# Patient Record
Sex: Female | Born: 1980 | Race: White | Hispanic: No | Smoking: Never smoker
Health system: Southern US, Community
[De-identification: ages and names within clinical notes are randomized; demographics above are authoritative.]

## PROBLEM LIST (undated history)

## (undated) DIAGNOSIS — N200 Calculus of kidney: Secondary | ICD-10-CM

## (undated) DIAGNOSIS — N970 Female infertility associated with anovulation: Secondary | ICD-10-CM

## (undated) DIAGNOSIS — N02B9 Other recurrent and persistent immunoglobulin A nephropathy: Secondary | ICD-10-CM

## (undated) DIAGNOSIS — N028 Recurrent and persistent hematuria with other morphologic changes: Secondary | ICD-10-CM

## (undated) HISTORY — PX: RENAL BIOPSY: SHX156

---

## 2007-04-16 ENCOUNTER — Inpatient Hospital Stay (HOSPITAL_COMMUNITY): Admission: EM | Admit: 2007-04-16 | Discharge: 2007-04-17 | Payer: Self-pay | Admitting: Emergency Medicine

## 2010-07-22 ENCOUNTER — Inpatient Hospital Stay (HOSPITAL_COMMUNITY)
Admission: AD | Admit: 2010-07-22 | Discharge: 2010-07-22 | Disposition: A | Discharge status: Home / Self Care | Payer: BC Managed Care – PPO | Source: Ambulatory Visit | Attending: Obstetrics and Gynecology | Admitting: Obstetrics and Gynecology

## 2010-07-22 DIAGNOSIS — R300 Dysuria: Secondary | ICD-10-CM | POA: Insufficient documentation

## 2010-07-22 DIAGNOSIS — N390 Urinary tract infection, site not specified: Secondary | ICD-10-CM | POA: Insufficient documentation

## 2010-07-22 DIAGNOSIS — O2390 Unspecified genitourinary tract infection in pregnancy, unspecified trimester: Secondary | ICD-10-CM | POA: Insufficient documentation

## 2010-07-22 LAB — URINALYSIS, ROUTINE W REFLEX MICROSCOPIC
Bilirubin Urine: NEGATIVE
Ketones, ur: NEGATIVE mg/dL
Nitrite: NEGATIVE
Protein, ur: NEGATIVE mg/dL
pH: 6 (ref 5.0–8.0)

## 2010-07-22 LAB — URINE MICROSCOPIC-ADD ON

## 2010-07-23 LAB — URINE CULTURE
Colony Count: NO GROWTH
Culture  Setup Time: 201202051953

## 2010-07-25 ENCOUNTER — Inpatient Hospital Stay (HOSPITAL_COMMUNITY)
Admission: AD | Admit: 2010-07-25 | Payer: BC Managed Care – PPO | Source: Ambulatory Visit | Admitting: Obstetrics and Gynecology

## 2010-07-25 ENCOUNTER — Inpatient Hospital Stay (HOSPITAL_COMMUNITY): Payer: BC Managed Care – PPO

## 2010-07-25 ENCOUNTER — Observation Stay (HOSPITAL_COMMUNITY)
Admission: AD | Admit: 2010-07-25 | Discharge: 2010-07-27 | DRG: 886 | Disposition: A | Discharge status: Home / Self Care | Payer: BC Managed Care – PPO | Source: Ambulatory Visit | Attending: Obstetrics and Gynecology | Admitting: Obstetrics and Gynecology

## 2010-07-25 DIAGNOSIS — N200 Calculus of kidney: Secondary | ICD-10-CM | POA: Diagnosis present

## 2010-07-25 DIAGNOSIS — O26839 Pregnancy related renal disease, unspecified trimester: Principal | ICD-10-CM | POA: Diagnosis present

## 2010-07-25 DIAGNOSIS — N1330 Unspecified hydronephrosis: Secondary | ICD-10-CM | POA: Diagnosis present

## 2010-07-25 DIAGNOSIS — R1031 Right lower quadrant pain: Secondary | ICD-10-CM | POA: Diagnosis present

## 2010-07-25 DIAGNOSIS — O212 Late vomiting of pregnancy: Secondary | ICD-10-CM | POA: Diagnosis present

## 2010-07-25 LAB — URINALYSIS, DIPSTICK ONLY
Bilirubin Urine: NEGATIVE
Nitrite: NEGATIVE
Specific Gravity, Urine: <1.005 — ABNORMAL LOW (ref 1.005–1.030)
Urine Glucose, Fasting: NEGATIVE mg/dL
pH: 6 (ref 5.0–8.0)

## 2010-07-25 LAB — COMPREHENSIVE METABOLIC PANEL
Albumin: 3.1 g/dL — ABNORMAL LOW (ref 3.5–5.2)
BUN: 5 mg/dL — ABNORMAL LOW (ref 6–23)
Calcium: 8.8 mg/dL (ref 8.4–10.5)
Creatinine, Ser: 0.52 mg/dL (ref 0.4–1.2)
Glucose, Bld: 72 mg/dL (ref 70–99)
Total Protein: 6.5 g/dL (ref 6.0–8.3)

## 2010-07-25 LAB — CBC
HCT: 36.1 % (ref 36.0–46.0)
MCHC: 33.2 g/dL (ref 30.0–36.0)
MCV: 89.1 fL (ref 78.0–100.0)
Platelets: 249 10*3/uL (ref 150–400)
RDW: 13.4 % (ref 11.5–15.5)

## 2010-07-26 ENCOUNTER — Inpatient Hospital Stay (HOSPITAL_COMMUNITY): Payer: BC Managed Care – PPO

## 2010-07-26 ENCOUNTER — Encounter (HOSPITAL_COMMUNITY): Payer: Self-pay | Admitting: Radiology

## 2010-07-26 LAB — DIFFERENTIAL
Basophils Relative: 0 % (ref 0–1)
Lymphocytes Relative: 12 % (ref 12–46)
Lymphs Abs: 1.8 10*3/uL (ref 0.7–4.0)
Monocytes Relative: 8 % (ref 3–12)
Neutro Abs: 12.5 10*3/uL — ABNORMAL HIGH (ref 1.7–7.7)
Neutrophils Relative %: 81 % — ABNORMAL HIGH (ref 43–77)

## 2010-07-26 LAB — CBC
HCT: 33.6 % — ABNORMAL LOW (ref 36.0–46.0)
Hemoglobin: 10.9 g/dL — ABNORMAL LOW (ref 12.0–15.0)
MCH: 29.5 pg (ref 26.0–34.0)
RBC: 3.69 MIL/uL — ABNORMAL LOW (ref 3.87–5.11)

## 2010-07-26 LAB — CREATININE CLEARANCE, URINE, 24 HOUR
Creatinine Clearance: 169 mL/min — ABNORMAL HIGH (ref 75–115)
Creatinine, 24H Ur: 1263 mg/d (ref 700–1800)
Urine Total Volume-CRCL: 4400 mL

## 2010-07-27 ENCOUNTER — Encounter (HOSPITAL_COMMUNITY): Payer: Self-pay | Admitting: *Deleted

## 2010-07-27 LAB — PROTEIN, URINE, 24 HOUR: Collection Interval-UPROT: 24 hours

## 2010-08-10 NOTE — H&P (Signed)
NAME:  April Bates, April Bates               ACCOUNT NO.:  [id number]  MEDICAL RECORD NO.:  [id number]           PATIENT TYPE:  I  LOCATION:  9153                          [facility]ILITY:  [facility]  PHYSICIAN:  Lenoard Aden, M.D.DATE OF BIRTH:  January 01, 1981  DATE OF ADMISSION:  07/25/2010 DATE OF DISCHARGE:                             HISTORY & PHYSICAL   CHIEF COMPLAINT:  Nausea, vomiting and flank pain.  HISTORY OF PRESENT ILLNESS:  She is a 30 year old white female G1, P0 at 82 weeks' gestation.  The patient was previously seen in maternity admissions on July 22, 2010, with diagnosis of probable UTI, questionable pyelonephritis and urinalysis was consistent with esterase and nitrite positive with blood.  She was given a shot of Rocephin in maternity admissions and sent home with p.o. Keflex.  She had contractions at that time but no preterm cervical change.  She presented to the office in followup 24 hours ago with persistent slight discomfort in no preterm cervical change and was tolerating antibiotic as well. Last night, there was exacerbation of her nausea and vomiting and worsening of her flank pain and she presents to the hospital now with worsening flank pain.  She reports good fetal movement.  She denies regular contractions, bleeding, changing vaginal discharge or leakage of fluid.  She has a family history which is remarkable for diabetes, brain cancer and alcohol abuse and COPD.  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  SOCIAL HISTORY:  She is a nonsmoker, nondrinker.  She denies domestic or physical violence.  MEDICATIONS:  Prenatal vitamins.  Prenatal course to date has otherwise been uncomplicated.  PHYSICAL  EXAMINATION:  GENERAL:  The patient is an uncomfortable- appearing white female but not in acute distress. VITAL SIGNS:  Blood pressure is 114/68, temperature 98.3. HEENT:  Normal. NECK:  Supple.  Full range of motion. LUNGS:  Clear. HEART:  Regular rhythm. ABDOMEN:  Soft, gravid, nontender.  There is distinct tenderness to palpation along the right flank moving around to the right lower quadrant. ABDOMEN:  Soft and nontender.  There is no rebound or guarding noted. There is negative Murphy sign. BACK:  There is no CVA tenderness. PELVIC:  Cervix to be closed, about 30% effaced, vertex and -2. EXTREMITIES:  There are no cords. NEUROLOGIC:  Intact. SKIN:  Nonfocal.  NST is reactive.  No active contractions were noted.  CBC, CMP are pending, 24-hour urine was started, clean catch pending.  IMPRESSION: 1. A 35-week intrauterine pregnancy. 2. Worsening flank pain with negative urine culture on July 22, 2010.  Signs and symptoms suggestive of possible urolithiasis     versus pyelonephritis. 3. History of IgA nephropathy with documented by kidney biopsy with     normal baseline 24-hour urine.  PLAN:  To admit at this time.  We will administer ceftriaxone 1 g IV q.24 h.  We will obtain renal ultrasound.  Check CBC, CMP, urinalysis, start 24-hour urine for creatinine clearance and urinary protein excretion.  Given Stadol 1 mg IV and Phenergan for pain relief at this time.     Lenoard Aden, M.D.     RJT/MEDQ  D:  07/25/2010  T:  07/26/2010  Job:  [id number]  Electronically Signed by Olivia Mackie M.D. on 08/10/2010 11:07:36 AM

## 2010-08-31 NOTE — Consult Note (Signed)
NAME:  April Bates, April Bates               ACCOUNT NO.:  [id number]  MEDICAL RECORD NO.:  [id number]           PATIENT TYPE:  I  LOCATION:  9153                          [facility]ILITY:  [facility]  PHYSICIAN:  Danielly Ackerley C. Vernie Ammons, M.D.  DATE OF BIRTH:  1981-05-17  DATE OF CONSULTATION:  07/26/2010 DATE OF DISCHARGE:                                CONSULTATION   REQUESTING PHYSICIAN:  Lenoard Aden, MD  HISTORY OF PRESENT ILLNESS:  This is a 30 year old white female who is [redacted] weeks pregnant, admitted on July 25, 2010 with irretractable nausea, vomiting, and right flank pain.  The right flank pain has been progressively worsening over the past 2-3 days.  She denies gross hematuria.  She was treated recently for a suspected UTI versus early pyelonephritis and has been taking p.o. Keflex daily.  She denies any prior history of nephrolithiasis.  She currently denies dysuria, increased frequency, urgency, fever or chills.  On admission, a renal ultrasound was performed which did demonstrate some right hydronephrosis as well as left renal pelvis fullness but no urinary calculi were detected.  The patient's right flank pain persisted and therefore a CT abdomen and pelvis was performed.  CT A and P performed on July 26, 2010 confirms a 4-mm right distal ureteral calculus at the ureterovesical junction.  Also, bilateral nonobstructing renal calculi.  LABORATORY DATA:  Urinalysis at admission was negative except for some blood.  Urine culture and sensitivity was negative. CBC at admission showed an elevated white count at 15.  BUN and creatinine are normal.  PHYSICAL EXAMINATION:  GENERAL:  The patient is normocephalic and atraumatic.  She is alert and oriented x3 in no acute distress. VITAL SIGNS:  Vital signs are stable and the patient is afebrile. ABDOMEN:  The abdomen is gravid.  She is nontender and there are normal bowel sounds. BACK:  There is no CVA tenderness elicited.  For a full review of  past medical history, social history, family history, and surgical history, please refer to the admission H and P.  ASSESSMENT AND PLAN:  This is a 35-week pregnant female with a right distal ureteral stone at the ureterovesical junction which measures 4 mm.  She also has multiple nonobstructing bilateral renal calculi. 1. Recommend Toradol 15 mg IV q. 4 hours p.r.n. pain if okay per     Obstetrics. 2. Strain urine until stone passes. 3. Flomax 0.4 mg p.o. daily until stone passes. 4. Once the patient is tolerating p.o. medications, we would advise     Percocet 5/325 mg 1-2 p.o. q.4 h. p.r.n. pain and Zofran 8 mg     dissolvable tablets q.6 h. p.r.n. nausea and vomiting. 5. The patient will need to arrange a followup with Urology as an     outpatient as needed between now and delivery.  Otherwise, she     should call the office and arrange followup sometime after delivery     to discuss stone prevention and also obtain metabolic stone     evaluation in light of multiple bilateral renal stones.    ______________________________ Marcello Fennel, PA   ______________________________ Veverly Fells.  Vernie Ammons, M.D.    AB/MEDQ  D:  07/26/2010  T:  07/27/2010  Job:  [id number]  Electronically Signed by Marcello Fennel PA on 08/30/2010 04:16:03 PM Electronically Signed by Ihor Gully M.D. on 08/31/2010 01:48:08 PM

## 2010-09-01 ENCOUNTER — Inpatient Hospital Stay (HOSPITAL_COMMUNITY)
Admission: AD | Admit: 2010-09-01 | Discharge: 2010-09-03 | DRG: 373 | Disposition: A | Discharge status: Home / Self Care | Payer: BC Managed Care – PPO | Source: Ambulatory Visit | Attending: Obstetrics and Gynecology | Admitting: Obstetrics and Gynecology

## 2010-09-01 ENCOUNTER — Inpatient Hospital Stay (HOSPITAL_COMMUNITY)
Admission: AD | Admit: 2010-09-01 | Payer: BC Managed Care – PPO | Source: Ambulatory Visit | Admitting: Obstetrics and Gynecology

## 2010-09-01 DIAGNOSIS — N059 Unspecified nephritic syndrome with unspecified morphologic changes: Secondary | ICD-10-CM | POA: Diagnosis present

## 2010-09-01 DIAGNOSIS — O480 Post-term pregnancy: Principal | ICD-10-CM | POA: Diagnosis present

## 2010-09-01 DIAGNOSIS — O26839 Pregnancy related renal disease, unspecified trimester: Secondary | ICD-10-CM | POA: Diagnosis present

## 2010-09-01 LAB — CBC
HCT: 36.4 % (ref 36.0–46.0)
Hemoglobin: 12.1 g/dL (ref 12.0–15.0)
MCH: 29.7 pg (ref 26.0–34.0)
MCHC: 33.2 g/dL (ref 30.0–36.0)

## 2010-09-01 LAB — RPR: RPR Ser Ql: NONREACTIVE

## 2010-09-02 LAB — CBC
HCT: 35 % — ABNORMAL LOW (ref 36.0–46.0)
Hemoglobin: 11.5 g/dL — ABNORMAL LOW (ref 12.0–15.0)
MCH: 29.6 pg (ref 26.0–34.0)
MCHC: 32.9 g/dL (ref 30.0–36.0)

## 2010-09-06 ENCOUNTER — Ambulatory Visit (HOSPITAL_COMMUNITY)
Admission: RE | Admit: 2010-09-06 | Discharge: 2010-09-06 | Disposition: A | Discharge status: Home / Self Care | Payer: BC Managed Care – PPO | Source: Ambulatory Visit | Attending: Obstetrics and Gynecology | Admitting: Obstetrics and Gynecology

## 2010-09-06 DIAGNOSIS — O9212 Cracked nipple associated with the puerperium: Secondary | ICD-10-CM | POA: Insufficient documentation

## 2010-09-06 DIAGNOSIS — O9279 Other disorders of lactation: Secondary | ICD-10-CM | POA: Insufficient documentation

## 2010-09-06 NOTE — H&P (Signed)
  April Bates, April Bates               ACCOUNT NO.:  [id number]  MEDICAL RECORD NO.:  [id number]           PATIENT TYPE:  I  LOCATION:  9134                          [facility]  [facility]  PHYSICIAN:  Lenoard Aden, M.D.DATE OF BIRTH:  1980/06/28  DATE OF ADMISSION:  09/01/2010 DATE OF DISCHARGE:                             HISTORY & PHYSICAL   CHIEF COMPLAINT:  Postdates with IgA nephropathy for induction.  HISTORY OF PRESENT ILLNESS:  A 30 year old white female G1 P0 at 40-3/7 weeks' gestation for induction due to postdate status, favorable cervix, and IgA nephropathy.  PAST MEDICAL HISTORY:  Remarkable for IgA nephropathy documented by biopsy and urolithiasis during this pregnancy.  Her medical history is also remarkable for Guillain-Barre syndrome in 2002.  She has allergies to SULFA.  MEDICATIONS:  Prenatal vitamins.  She is a nonsmoker and nondrinker.  She denies domestic or physical violence.  She has social history which is otherwise noncontributory.  FAMILY HISTORY:  Remarkable for COPD, brain cancer, diabetes, and alcohol abuse.  Prenatal course complicated by urolithiasis for which the patient was hospitalized and managed expectantly and passed the stone.  PHYSICAL EXAMINATION:  GENERAL:  She is a well-developed, well-nourished white female in no acute distress. HEENT:  Normal. LUNGS:  Clear. HEART:  Regular rate and rhythm. ABDOMEN:  Soft, gravid, nontender.  Estimated fetal weight 7.5 pounds. Cervix is 4 cm, 70% vertex, -1. EXTREMITIES:  There are no cords. NEUROLOGIC:  Nonfocal. SKIN:  Intact.  Fetal heart rate tracing in the 140-150 beat per minute range with accelerations 15 x 15, no decelerations, irregular contractions category I tracing.  IMPRESSION: 1. Postdates intrauterine pregnancy. 2. IgA nephropathy. 3. History of urolithiasis.  PLAN:  Admit, Pitocin induction, epidural and analgesia as needed, anticipate cautious attempts at vaginal  delivery.     Lenoard Aden, M.D.     RJT/MEDQ  D:  09/01/2010  T:  09/02/2010  Job:  [id number]  Electronically Signed by Olivia Mackie M.D. on 09/06/2010 01:28:09 PM

## 2010-09-06 NOTE — H&P (Signed)
  April Bates, April Bates               ACCOUNT NO.:  [id number]  MEDICAL RECORD NO.:  [id number]           PATIENT TYPE:  I  LOCATION:  9162                          [facility]  PHYSICIAN:  Lenoard Aden, M.D.DATE OF BIRTH:  January 11, 1981  DATE OF ADMISSION:  09/01/2010 DATE OF DISCHARGE:                             HISTORY & PHYSICAL   CHIEF COMPLAINT:  Postdates induction with a history of IgA nephropathy.  HISTORY OF PRESENT ILLNESS:  A 30 year old white female G1, P0 at 25- 3/7th weeks' gestation, who presents for induction due to postdate status, favorable cervix, and IgA nephropathy.  She has past medical history remarkable for IJ nephropathy documented by kidney biopsy.  She has a history of Guillain-Barre syndrome in 2002.  She is a nonsmoker, nondrinker.  Denies domestic physical violence.  ALLERGIES:  She has allergies to SULFA.  MEDICATIONS:  Prenatal vitamins.  PREGNANCY COURSE:  Complicated by urolithiasis.  PHYSICAL EXAMINATION:  VITAL SIGNS:  Stable. GENERAL:  Patient is afebrile. HEENT:  Normal. NECK:  Supple.  Full range of motion. LUNGS:  Clear. HEART:  Regular rhythm. ABDOMEN:  Soft, gravid, nontender.  Estimated fetal weight 7.5 pounds. Cervix is 4 cm, 70%, vertex, -1. EXTREMITIES:  No cords. NEUROLOGIC:  Nonfocal. SKIN:  Intact.  IMPRESSION:  A 40-3/7th-week intrauterine pregnancy for; 1. Postdates induction. 2. IgA nephropathy. 3. History of urolithiasis.  PLAN:  Admit.  Pitocin induction, epidural p.r.n.  Anticipate cautious attempts at vaginal delivery.     Lenoard Aden, M.D.     RJT/MEDQ  D:  09/01/2010  T:  09/01/2010  Job:  [id number]  Electronically Signed by Olivia Mackie M.D. on 09/06/2010 01:28:04 PM

## 2010-10-11 ENCOUNTER — Ambulatory Visit (HOSPITAL_COMMUNITY)
Admission: RE | Admit: 2010-10-11 | Discharge: 2010-10-11 | Disposition: A | Discharge status: Home / Self Care | Payer: BC Managed Care – PPO | Source: Ambulatory Visit | Attending: Obstetrics and Gynecology | Admitting: Obstetrics and Gynecology

## 2010-10-30 NOTE — Discharge Summary (Signed)
NAMEMarland Kitchen  April Bates, April Bates               ACCOUNT NO.:  [id number]   MEDICAL RECORD NO.:  [id number]          PATIENT TYPE:  INP   LOCATION:  5010                         FACILITY:  [facility]   PHYSICIAN:  Altha Harm, MDDATE OF BIRTH:  17-Aug-1980   DATE OF ADMISSION:  04/16/2007  DATE OF DISCHARGE:  04/17/2007                               DISCHARGE SUMMARY   DISCHARGE DISPOSITION:  Home.   FINAL DISCHARGE DIAGNOSES:  Allergic response to Bactrim.   DISCHARGE MEDICATIONS:  1. Prednisone 40 mg p.o. daily x5 days.  2. Benadryl 25 to 50 mg p.o. q.8h. p.r.n.   CONSULTANTS:  None.   PROCEDURES:  None.   DIAGNOSTIC STUDIES:  None.   CODE STATUS:  Full code.   ALLERGIES:  BACTRIM.   CHIEF COMPLAINT:  Fever and full-body rash.   HISTORY OF PRESENT ILLNESS:  Please see H&P dictated by Dr. Mikeal Hawthorne for  details of the HPI.   HOSPITAL COURSE:  1. The patient was admitted and found to have a morbilliform rash in      response to Bactrim.  The patient was started on IV Solu-Medrol and      IV fluids for support.  The patient had significant resolution of      her symptoms after the IV Solu-Medrol and she was switched over to      p.o. prednisone.  The rash continued to resolve as the day went      along and the patient no longer had any fevers.  The patient is      being discharged home on prednisone for a total of 5 days and      Benadryl, to be used p.r.n. for itching.  2. Hyponatremia, resolved with IV fluids.  3. Hypertension, resolved.   DIETARY RESTRICTIONS:  None.   PHYSICAL RESTRICTIONS:  None.   FOLLOWUP:  Patient to follow up with her dermatologist in 72 hours for  reevaluation.  Patient has been cautioned that if the rash continues to  get worse, if she notices any desquamation or she notices any difficulty  swallowing or soreness of her mucosal membranes, that she is to come  back to the emergency room for continued therapy.      Altha Harm, MD  Electronically Signed     MAM/MEDQ  D:  04/17/2007  T:  04/18/2007  Job:  [id number]

## 2010-10-30 NOTE — H&P (Signed)
NAME:  April Bates, April Bates               ACCOUNT NO.:  [id number]   MEDICAL RECORD NO.:  [id number]          PATIENT TYPE:  EMS   LOCATION:  MAJO                         FACILITY:  [facility]   PHYSICIAN:  Lonia Blood, M.D.      DATE OF BIRTH:  04/15/1981   DATE OF ADMISSION:  04/16/2007  DATE OF DISCHARGE:                              HISTORY & PHYSICAL   PRIMARY CARE PHYSICIAN:  The patient goes to [facility], so she is  unassigned to [place].   PRESENTING COMPLAINT:  Generalized rashes.   HISTORY OF PRESENT ILLNESS:  The patient is a 30 year old female with no  significant past medical history who apparently had some herpes labialis  a couple of days ago.  She went to her primary care doctor, at which  point she was placed on Bactrim for treatment of the lesion.  Two days  later, the patient had a complete skin breakdown with rashes.  She went  back to her primary care physician and was referred to a dermatologist.  She saw Dr. Michaell Cowing, who is a dermatologist.  Symptoms persisted so she  was instructed to stop all medications, including the Bactrim.  The  patient was noted to have nausea and vomiting, however, today, and after  consulting her dermatologist she was referred to the hospital with  questionable Stevens-Johnson syndrome.  The patient denied any diarrhea,  but she has had fever and chills as well.  So far no mucosal lesions.   PAST MEDICAL HISTORY:  Nothing significant.   SOCIAL HISTORY:  The patient is single.  Denies any tobacco or alcohol  use or IV drug use.   FAMILY HISTORY:  Remote history of COPD in her grandfather, otherwise  nothing significant.   MEDICATIONS:  None.   REVIEW OF SYSTEMS:  A 12-point review of systems is negative except per  HPI.   PHYSICAL EXAMINATION:  VITAL SIGNS:  Temperature 102.7, blood pressure  108/68, pulse 117, respiratory rate 20, sats 98% room air.  GENERAL:  She is a pleasant young woman in no acute distress.  HEENT:  PERRL.  EOMI.  NECK:   Supple.  No JVD, no lymphadenopathy.  RESPIRATORY:  She has good air entry bilaterally.  No wheezes, no rales.  CARDIOVASCULAR:  The patient has S1-S2.  No murmurs.  ABDOMEN:  Soft, nontender with positive bowel sounds.  EXTREMITIES:  No  edema, cyanosis or clubbing.  SKIN:  The skin exam showed widespread raised maculopapular lesions  involving the face, the trunk and the limbs.  She has a crusting  herpetic lesion on the right upper lip, otherwise no other lesions.   Her labs showed a white count is 6.2, hemoglobin 13.8, with platelet  count of 164.  Sodium is 131, potassium 3.8, chloride 101, BUN 8,  glucose 86, creatinine 0.9.   ASSESSMENT:  This is a 30 year old female with widespread lesions,  probably questionable Stevens-Johnson syndrome but could also be sulfa  allergy or any reaction to her Bactrim.  The plan therefore will be:  1. 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 The patient has seen Dr. Michaell Cowing,  who is a Armed forces operational officer, and currently taken off all medications.      It is not clear if she truly has Stevens-Johnson or any of the      related syndromes.  At this point, based on the literature, the use      steroid is controversial, especially if it is more than 48 hours;      however, the patient has not even tried steroids from day one.  Our      plan will therefore will be to give her a short course of no more      than 24 hours of steroids.  I will use Solu-Medrol in this case to      see if that helps improvement.  If not would consider IVIG.  The      bulk of the management, however, will be supportive care.  I will      observe her closely in the hospital for any untoward problems.  2. Nausea, vomiting.  Although the patient had no mucosal lesions to      suggest Stevens-Johnson, the fact that she is having nausea and      vomiting could mean she may have some lesions in her GI tract that      we are unable to see.  At this point would do supportive measures      to  control her nausea, vomiting, including Phenergan and Zofran as      needed.  3. Hyponatremia.  The patient's sodium is low, probably from      dehydration from her nausea and vomiting.  I will therefore give      her some normal saline and recheck her labs in the morning.  4. Fever.  This is most likely related to her overall lesions if she      truly has Levonne Spiller.  I will check blood cultures and      probably UA on her.  Otherwise further treatment will depend on how      the patient responds while in the hospital as this is going to be      entirely for the most part supportive care.      Lonia Blood, M.D.  Electronically Signed     LG/MEDQ  D:  04/16/2007  T:  04/17/2007  Job:  [id number]

## 2010-11-20 ENCOUNTER — Ambulatory Visit (HOSPITAL_COMMUNITY)
Admission: RE | Admit: 2010-11-20 | Payer: BC Managed Care – PPO | Source: Ambulatory Visit | Admitting: Obstetrics and Gynecology

## 2011-03-27 LAB — CULTURE, BLOOD (ROUTINE X 2): Culture: NO GROWTH

## 2011-03-27 LAB — URINALYSIS, ROUTINE W REFLEX MICROSCOPIC
Bilirubin Urine: NEGATIVE
Glucose, UA: NEGATIVE
Ketones, ur: >80 — AB
Leukocytes, UA: NEGATIVE
Nitrite: NEGATIVE
Protein, ur: NEGATIVE
Specific Gravity, Urine: 1.019
Urobilinogen, UA: 0.2
pH: 6

## 2011-03-27 LAB — BASIC METABOLIC PANEL
CO2: 17 — ABNORMAL LOW
Chloride: 108
GFR calc non Af Amer: >60
Glucose, Bld: 91
Potassium: 4.3
Sodium: 137

## 2011-03-27 LAB — URINE MICROSCOPIC-ADD ON

## 2011-03-27 LAB — CBC
HCT: 38.2
Hemoglobin: 13.1
MCHC: 34.4
MCV: 86.5
MCV: 87.1
Platelets: 164
RDW: 13.1

## 2011-03-27 LAB — COMPREHENSIVE METABOLIC PANEL
Albumin: 3.5
Alkaline Phosphatase: 42
BUN: 6
Calcium: 8 — ABNORMAL LOW
Creatinine, Ser: 0.88
Potassium: 3.6
Total Protein: 6.2

## 2011-03-27 LAB — I-STAT 8, (EC8 V) (CONVERTED LAB)
Acid-base deficit: 4 — ABNORMAL HIGH
BUN: 8
Bicarbonate: 17.9 — ABNORMAL LOW
Chloride: 101
Glucose, Bld: 86
HCT: 43
Hemoglobin: 14.6
Operator id: 275321
Potassium: 3.8
Sodium: 131 — ABNORMAL LOW
TCO2: 19
pCO2, Ven: 25.1 — ABNORMAL LOW
pH, Ven: 7.46 — ABNORMAL HIGH

## 2011-03-27 LAB — POCT I-STAT CREATININE
Creatinine, Ser: 0.9
Operator id: 275321

## 2011-03-27 LAB — DIFFERENTIAL
Basophils Relative: 0
Eosinophils Absolute: 0.2
Neutrophils Relative %: 80 — ABNORMAL HIGH

## 2012-02-13 NOTE — Discharge Summary (Signed)
April Bates, April Bates               ACCOUNT NO.:  [id number]  MEDICAL RECORD NO.:  [id number]  LOCATION:  9153                          [facility]  PHYSICIAN:  Lenoard Aden, M.D.DATE OF BIRTH:  1981/05/29  DATE OF ADMISSION:  07/25/2010 DATE OF DISCHARGE:  07/27/2010                              DISCHARGE SUMMARY   ADMISSION DIAGNOSIS:  Pregnancy with urolithiasis.  DISCHARGE DIAGNOSIS:  Pregnancy with urolithiasis.  The patient was admitted due to symptomatic right urolithiasis, was given IV fluid hydration, Flomax, and pain reliever.  She was bit numb. It was shown by radiographic studies that has a nonobstructing renal calculi.  She was discharged to home on Flomax, Percocet, and prenatal vitamins.  She is to remain follow up with the urologist in 2 weeks.     Lenoard Aden, M.D.     RJT/MEDQ  D:  02/12/2012  T:  02/13/2012  Job:  [id number]

## 2012-06-17 NOTE — L&D Delivery Note (Signed)
Delivery Note  First Stage: Labor onset: 1000 Augmentation : none Analgesia /Anesthesia intrapartum: none AROM at 1623  Second Stage: Complete dilation at 1635 Onset of pushing at 1645 FHR second stage 150  Delivery of a viable female at 68 by CNM in ROA position Loose nuchal cord x1 -- slid down body at birth Cord double clamped after cessation of pulsation, cut by FOB  Patient assisted out of tub @ 1712 Newborn to Dad for bonding  Cord blood sample collected   Third Stage: Placenta delivered spontaneous Shultz intact with 3 VC @ 1714 Placenta disposition: hospital disposal Uterine tone firm / bleeding small - pitocin IVF  1st degree perineal laceration identified - no vaginal extension Anesthesia for repair: 1% xylocaine local Repair 4-0 subcuticular Est. Blood Loss (mL): 350  Complications: none  Mom to postpartum.  Baby to Mom for bonding.  Newborn: Birth Weight: 7 pounds -5 ounces Apgar Scores: 9-10 Feeding planned: breast  Marlinda Mike CNM, MSN 08/27/2012, 5:34 PM

## 2012-08-27 ENCOUNTER — Inpatient Hospital Stay (HOSPITAL_COMMUNITY)
Admission: AD | Admit: 2012-08-27 | Discharge: 2012-08-28 | DRG: 373 | Disposition: A | Discharge status: Home / Self Care | Payer: BC Managed Care – PPO | Source: Ambulatory Visit | Attending: Obstetrics & Gynecology | Admitting: Obstetrics & Gynecology

## 2012-08-27 ENCOUNTER — Encounter (HOSPITAL_COMMUNITY): Payer: Self-pay | Admitting: *Deleted

## 2012-08-27 DIAGNOSIS — O26839 Pregnancy related renal disease, unspecified trimester: Secondary | ICD-10-CM | POA: Diagnosis present

## 2012-08-27 HISTORY — DX: Calculus of kidney: N20.0

## 2012-08-27 HISTORY — DX: Other recurrent and persistent immunoglobulin A nephropathy: N02.B9

## 2012-08-27 HISTORY — DX: Female infertility associated with anovulation: N97.0

## 2012-08-27 HISTORY — DX: Recurrent and persistent hematuria with other morphologic changes: N02.8

## 2012-08-27 LAB — COMPREHENSIVE METABOLIC PANEL
ALT: 10 U/L (ref 0–35)
AST: 18 U/L (ref 0–37)
Albumin: 3.1 g/dL — ABNORMAL LOW (ref 3.5–5.2)
Alkaline Phosphatase: 146 U/L — ABNORMAL HIGH (ref 39–117)
BUN: 14 mg/dL (ref 6–23)
CO2: 21 mEq/L (ref 19–32)
Calcium: 9.5 mg/dL (ref 8.4–10.5)
Chloride: 104 mEq/L (ref 96–112)
Creatinine, Ser: 0.67 mg/dL (ref 0.50–1.10)
GFR calc Af Amer: >90 mL/min (ref >90)
GFR calc non Af Amer: >90 mL/min (ref >90)
Glucose, Bld: 83 mg/dL (ref 70–99)
Potassium: 3.9 mEq/L (ref 3.5–5.1)
Sodium: 138 mEq/L (ref 135–145)
Total Bilirubin: 0.5 mg/dL (ref 0.3–1.2)
Total Protein: 6.9 g/dL (ref 6.0–8.3)

## 2012-08-27 LAB — CBC
HCT: 38.8 % (ref 36.0–46.0)
Hemoglobin: 13 g/dL (ref 12.0–15.0)
MCH: 29.8 pg (ref 26.0–34.0)
MCHC: 33.5 g/dL (ref 30.0–36.0)
MCV: 89 fL (ref 78.0–100.0)
Platelets: 276 10*3/uL (ref 150–400)
RBC: 4.36 MIL/uL (ref 3.87–5.11)
RDW: 13.8 % (ref 11.5–15.5)
WBC: 13 10*3/uL — ABNORMAL HIGH (ref 4.0–10.5)

## 2012-08-27 LAB — OB RESULTS CONSOLE HIV ANTIBODY (ROUTINE TESTING): HIV: NONREACTIVE

## 2012-08-27 LAB — URIC ACID: Uric Acid, Serum: 5.4 mg/dL (ref 2.4–7.0)

## 2012-08-27 LAB — RPR: RPR Ser Ql: NONREACTIVE

## 2012-08-27 MED ORDER — OXYCODONE-ACETAMINOPHEN 5-325 MG PO TABS: 1.0000 | ORAL_TABLET | ORAL | Status: DC | PRN

## 2012-08-27 MED ORDER — SODIUM CHLORIDE 0.9 % IJ SOLN: 3.0000 mL | INTRAMUSCULAR | Status: DC | PRN

## 2012-08-27 MED ORDER — OXYTOCIN 10 UNIT/ML IJ SOLN
INTRAMUSCULAR | Status: AC
  Filled 2012-08-27: qty 2

## 2012-08-27 MED ORDER — ACETAMINOPHEN 325 MG PO TABS: 650.0000 mg | ORAL_TABLET | ORAL | Status: DC | PRN

## 2012-08-27 MED ORDER — IBUPROFEN 600 MG PO TABS
600.0000 mg | ORAL_TABLET | Freq: Four times a day (QID) | ORAL | Status: DC
  Administered 2012-08-27 – 2012-08-28 (×5): 600 mg via ORAL
  Filled 2012-08-27 (×5): qty 1

## 2012-08-27 MED ORDER — PRENATAL MULTIVITAMIN CH
1.0000 | ORAL_TABLET | Freq: Every day | ORAL | Status: DC
  Administered 2012-08-28: 1 via ORAL
  Filled 2012-08-27: qty 1

## 2012-08-27 MED ORDER — LANOLIN HYDROUS EX OINT: TOPICAL_OINTMENT | CUTANEOUS | Status: DC | PRN

## 2012-08-27 MED ORDER — OXYTOCIN 40 UNITS IN LACTATED RINGERS INFUSION - SIMPLE MED
62.5000 mL/h | INTRAVENOUS | Status: DC
  Administered 2012-08-27: 62.5 mL/h via INTRAVENOUS
  Filled 2012-08-27: qty 1000

## 2012-08-27 MED ORDER — CITRIC ACID-SODIUM CITRATE 334-500 MG/5ML PO SOLN: 30.0000 mL | ORAL | Status: DC | PRN

## 2012-08-27 MED ORDER — SODIUM CHLORIDE 0.9 % IJ SOLN: 3.0000 mL | Freq: Two times a day (BID) | INTRAMUSCULAR | Status: DC

## 2012-08-27 MED ORDER — WITCH HAZEL-GLYCERIN EX PADS
1.0000 "application " | MEDICATED_PAD | CUTANEOUS | Status: DC | PRN
  Administered 2012-08-28: 1 via TOPICAL

## 2012-08-27 MED ORDER — SIMETHICONE 80 MG PO CHEW: 80.0000 mg | CHEWABLE_TABLET | ORAL | Status: DC | PRN

## 2012-08-27 MED ORDER — LIDOCAINE HCL (PF) 1 % IJ SOLN
30.0000 mL | INTRAMUSCULAR | Status: DC | PRN
  Administered 2012-08-27: 30 mL via SUBCUTANEOUS
  Filled 2012-08-27 (×2): qty 30

## 2012-08-27 MED ORDER — IBUPROFEN 600 MG PO TABS: 600.0000 mg | ORAL_TABLET | Freq: Four times a day (QID) | ORAL | Status: DC | PRN

## 2012-08-27 MED ORDER — SODIUM CHLORIDE 0.9 % IV SOLN: 250.0000 mL | INTRAVENOUS | Status: DC | PRN

## 2012-08-27 MED ORDER — DIBUCAINE 1 % RE OINT: 1.0000 "application " | TOPICAL_OINTMENT | RECTAL | Status: DC | PRN

## 2012-08-27 MED ORDER — LACTATED RINGERS IV SOLN: 500.0000 mL | INTRAVENOUS | Status: DC | PRN

## 2012-08-27 MED ORDER — BENZOCAINE-MENTHOL 20-0.5 % EX AERO
1.0000 "application " | INHALATION_SPRAY | CUTANEOUS | Status: DC | PRN
  Administered 2012-08-27: 1 via TOPICAL
  Filled 2012-08-27: qty 56

## 2012-08-27 MED ORDER — SENNOSIDES-DOCUSATE SODIUM 8.6-50 MG PO TABS: 2.0000 | ORAL_TABLET | Freq: Every day | ORAL | Status: DC

## 2012-08-27 MED ORDER — DIPHENHYDRAMINE HCL 25 MG PO CAPS: 25.0000 mg | ORAL_CAPSULE | Freq: Four times a day (QID) | ORAL | Status: DC | PRN

## 2012-08-27 MED ORDER — OXYTOCIN BOLUS FROM INFUSION
500.0000 mL | INTRAVENOUS | Status: DC
  Administered 2012-08-27: 500 mL via INTRAVENOUS

## 2012-08-27 NOTE — Progress Notes (Signed)
S:  Uncomfortable with ctx - strong urge to push       In tub for past 15 minutes  O:  VS: Blood pressure 116/62, pulse 106, temperature 97.9 F (36.6 C), temperature source Oral, resp. rate 18, height 5' (1.524 m), weight 149 lb (67.586 kg).        FHR : baseline 150 intermittent - no audible decels        Toco: contractions every 2-4 minutes             Cervix : 9cm / 95% / vtx / +2 / BBOW / + show    (last nurse exam 1530 was 5-6cm)        Membranes: AROM - clear  A: active labor with rapid progress     reassuring FHR without audible decels      BP stable - PIH labs normal - no evidence of PIH / PEC  P: continuous labor support       anticipate SVB     Marlinda Mike CNM, MSN 08/27/2012, 1625pm

## 2012-08-27 NOTE — H&P (Signed)
OB ADMISSION/ HISTORY & PHYSICAL:  Admission Date: 08/27/2012  1:05 PM  Admit Diagnosis: Onset of labor / elevated BP  April Bates is a 32 y.o. female sent from office at 4-5 cm and irregular ctx / BP 130/90 in office.  Prenatal History: G2P1001   EDC : 08/20/2012, by Other Basis  Prenatal care at [facility]  Primary Ob Provider: Billy Coast Prenatal course complicated by proteinuria (managed by nephrology)  Prenatal Labs: ABO, Rh:   O positive Antibody: Negative (03/13 0000) Rubella:   Immune RPR: Nonreactive (03/13 0000)  HBsAg: Negative (03/13 0000)  HIV: Non-reactive (03/13 0000)  GBS: Negative (03/13 0000)  1 hr Glucola : NL  Medical / Surgical History :  Past medical history:  Past Medical History  Diagnosis Date  . IgA nephropathy   . Kidney stones   . Infertility associated with anovulation      Past surgical history:  Past Surgical History  Procedure Laterality Date  . Renal biopsy      Family History: No family history on file.   Social History:  reports that she has never smoked. She has never used smokeless tobacco. She reports that she does not drink alcohol or use illicit drugs.  Allergies: Sulfa antibiotics   Current Medications at time of admission:  Prenatal daily  Review of Systems: Ctx off and on for 48 hours - stronger and every 5 minutes since 1000 this AM No LOF No bleeding Active FM No PIH symptoms  Physical Exam:  VS: Blood pressure 116/62, pulse 106, temperature 98.2 F (36.8 C), temperature source Oral, resp. rate 18, height 5' (1.524 m), weight 149 lb (67.586 kg).  General: alert and oriented, appears more uncomfortable with ctx than at office Heart: RRR Lungs: Clear lung fields Abdomen: Gravid, soft and non-tender, non-distended / uterus: gravid / non-tender Extremities: trace edema  FHR: baseline rate 140  / variability moderate / accels + / decels none TOCO: 2-4 minutes  Assessment: [redacted] weeks  gestation latent stage of labor FHR category 1  Elevated BP - evaluating for PEC   Plan:  PIH labs Decision after labs resulted for CNM versus MD management (not candidate for waterbirth if PEC) Plan augmentation of labor if no progression in next 2 hours  Dr Billy Coast (primary) notified of admission / plan of care   Marlinda Mike CNM, MSN 08/27/2012, 1330pm

## 2012-08-28 LAB — CBC
HCT: 33 % — ABNORMAL LOW (ref 36.0–46.0)
Hemoglobin: 11.2 g/dL — ABNORMAL LOW (ref 12.0–15.0)
MCH: 30.3 pg (ref 26.0–34.0)
MCHC: 33.9 g/dL (ref 30.0–36.0)
MCV: 89.2 fL (ref 78.0–100.0)
Platelets: 218 10*3/uL (ref 150–400)
RBC: 3.7 MIL/uL — ABNORMAL LOW (ref 3.87–5.11)
RDW: 14 % (ref 11.5–15.5)
WBC: 14 10*3/uL — ABNORMAL HIGH (ref 4.0–10.5)

## 2012-08-28 MED ORDER — MEASLES, MUMPS & RUBELLA VAC ~~LOC~~ INJ
0.5000 mL | INJECTION | Freq: Once | SUBCUTANEOUS | Status: AC
  Administered 2012-08-28: 0.5 mL via SUBCUTANEOUS
  Filled 2012-08-28: qty 0.5

## 2012-08-28 MED ORDER — SIMETHICONE 80 MG PO CHEW: 80.0000 mg | CHEWABLE_TABLET | ORAL | Status: DC | PRN

## 2012-08-28 MED ORDER — IBUPROFEN 600 MG PO TABS: 600.0000 mg | ORAL_TABLET | Freq: Four times a day (QID) | ORAL | Status: DC | PRN

## 2012-08-28 NOTE — Discharge Summary (Signed)
Physician Discharge Summary  Patient ID: April Bates MRN: [id number] DOB/AGE: 21-Feb-1981 32 y.o.  Admit date: 08/27/2012 Discharge date: 08/28/2012  Admission Diagnoses: admitted in active labor with elevated B/P. Desired water birth. GBS: neg. Antenatal course complicated by Proteinuria - nephrology management.  Discharge Diagnoses:  Active Problems:   Postpartum care following vaginal delivery / waterbirth (3/13)   SVD (spontaneous vaginal delivery)   Discharged Condition: stable  Hospital Course: uncomplicated NSVD - water birth  Consults:  Management of Antenatal Proteinuria by Nephrology  Significant Diagnostic Studies: labs:Routine PN labs normal: Proteinuria ante natally - nephrology management. microbiology: GBS: neg and radiology: Ultrasound: anatomy - normal  Treatments: IV hydration and analgesia: ibuprofen  Discharge Exam: Blood pressure 102/65, pulse 91, temperature 98.2 F (36.8 C), temperature source Oral, resp. rate 18, height 5' (1.524 m), weight 67.586 kg (149 lb), SpO2 99.00%, unknown if currently breastfeeding.  Physical Examination:  General appearance: alert, cooperative and no distress Affect: AAO x 3 Lungs: CTAB CV: RRR Breasts: N/T Abdomen: Soft, N/T, B/s x 4. Fundus -2/u, firm GI: Tolerating normal diet and had flatus ++, no BM since delivery GU: No problems voiding Lochia: Min, Rubra Perineum: 1st degree perineal laceration healing well - advised re peri - care Extremities: Upper and Lower, No swelling/ edema  Disposition: 01-Home or Self Care  Discharge Orders   Future Orders Complete By Expires     Activity as tolerated  As directed     Diet general  As directed     Discharge instructions  As directed     Comments:      Per [facility] Booklet    Lifting restrictions  As directed     Comments:      Weight restriction of 40 lbs.    Sexual acrtivity  As directed     Comments:      Advised no Intercourse x 6 weeks         Medication List    TAKE these medications       ibuprofen 600 MG tablet  Commonly known as:  ADVIL,MOTRIN  Take 1 tablet (600 mg total) by mouth every 6 (six) hours as needed for pain.     prenatal multivitamin Tabs  Take 1 tablet by mouth daily at 12 noon.     simethicone 80 MG chewable tablet  Commonly known as:  MYLICON  Chew 1 tablet (80 mg total) by mouth as needed for flatulence.           Follow-up Information   Follow up with [facility] Schedule an appointment as soon as possible for a visit in 6 weeks. (As needed)    Contact information:   [address] [place] [address] [phone]      Signed: Earl Gala 08/28/2012, 6:36 PM

## 2012-08-28 NOTE — Progress Notes (Signed)
PPD 1 SVD/waterbirth viable Female Infant  S:  Reports feeling well             Tolerating po/ No nausea or vomiting             Bleeding is moderate             Pain controlled withibuprofen (OTC)             Up ad lib / ambulatory / voiding QS  Newborn breast feeding     O:               VS: BP 102/65  Pulse 91  Temp(Src) 98.2 F (36.8 C) (Oral)  Resp 18  Ht 5' (1.524 m)  Wt 67.586 kg (149 lb)  BMI 29.1 kg/m2  SpO2 99%   LABS:  Recent Labs  08/27/12 1330 08/28/12 0605  WBC 13.0* 14.0*  HGB 13.0 11.2*  PLT 276 218                                         I&O: Intake/Output     03/13 0701 - 03/14 0700 03/14 0701 - 03/15 0700   Blood 350    Total Output 350     Net -350                        Physical Exam:             Alert and oriented X3  Lungs: Clear and unlabored  Heart: regular rate and rhythm / no mumurs  Abdomen: soft, non-tender, non-distended              Fundus: firm, non-tender, U-2  Perineum: 1st degree laceration healing well - ice pack in place   Lochia: moderate, rubra  Extremities: no edema, no calf pain or tenderness    A: PPD # 1   Doing well - stable status  P:  Routine post partum orders  Recovering well and Baby feeding well both mother and baby stable.  DAVIES, DENISE CNM. 08/28/2012, 10:53 AM

## 2014-04-18 ENCOUNTER — Encounter (HOSPITAL_COMMUNITY): Payer: Self-pay | Admitting: *Deleted

## 2014-06-17 NOTE — L&D Delivery Note (Addendum)
Delivery Note  First Stage: Labor onset: 2200 on 03/27/2015 Augmentation : none Analgesia /Anesthesia intrapartum: water immersion AROM at 0601  Second Stage: Complete dilation at 0654 after manual reduction of anterior lip with pushing Onset of pushing at 0600 with anterior lip FHR second stage 155 bpm - intermittent monitoring  Delivery of a viable female at [phone] by CNM in LOP position No nuchal cord Cord double clamped after cessation of pulsation, cut by FOB Cord blood sample collected   Third Stage: Placenta delivered via Tomasa Blase intact with 3 VC @ 0715 Placenta disposition: L&D Uterine tone firm / bleeding scant  2nd degree laceration identified  1 cm abscess on RT vaginal wall / I&D of small amount white drainage, no foul odor / complete evacuation of drainage with no abnormal appearance to vaginal tissue  Anesthesia for repair: 1% Lidocaine Repair 3.0 vicryl Est. Blood Loss (mL): 75  Complications: none  Mom to postpartum.  Baby to Couplet care / Skin to Skin.  Newborn: Birth Weight: 7 lbs 6.5 oz  Apgar Scores: 7/8 Feeding planned: breast  Raelyn Mora, M  MSN, CNM 03/28/2015, 7:44 AM

## 2014-08-10 LAB — OB RESULTS CONSOLE ANTIBODY SCREEN: ANTIBODY SCREEN: NEGATIVE

## 2014-08-10 LAB — OB RESULTS CONSOLE HIV ANTIBODY (ROUTINE TESTING): HIV: NONREACTIVE

## 2014-08-10 LAB — OB RESULTS CONSOLE HEPATITIS B SURFACE ANTIGEN: HEP B S AG: NEGATIVE

## 2014-08-10 LAB — OB RESULTS CONSOLE ABO/RH: RH Type: POSITIVE

## 2014-08-10 LAB — OB RESULTS CONSOLE RPR: RPR: NONREACTIVE

## 2014-08-10 LAB — OB RESULTS CONSOLE RUBELLA ANTIBODY, IGM: Rubella: IMMUNE

## 2014-08-29 LAB — OB RESULTS CONSOLE GC/CHLAMYDIA
CHLAMYDIA, DNA PROBE: NEGATIVE
Gonorrhea: NEGATIVE

## 2015-02-22 LAB — OB RESULTS CONSOLE GBS: GBS: NEGATIVE

## 2015-03-28 ENCOUNTER — Inpatient Hospital Stay (HOSPITAL_COMMUNITY)
Admission: AD | Admit: 2015-03-28 | Discharge: 2015-03-29 | DRG: 774 | Disposition: A | Discharge status: Home / Self Care | Payer: BLUE CROSS/BLUE SHIELD | Source: Ambulatory Visit | Attending: Obstetrics and Gynecology | Admitting: Obstetrics and Gynecology

## 2015-03-28 ENCOUNTER — Encounter (HOSPITAL_COMMUNITY): Payer: Self-pay | Admitting: *Deleted

## 2015-03-28 DIAGNOSIS — Z3A41 41 weeks gestation of pregnancy: Secondary | ICD-10-CM

## 2015-03-28 DIAGNOSIS — Z87442 Personal history of urinary calculi: Secondary | ICD-10-CM | POA: Diagnosis not present

## 2015-03-28 DIAGNOSIS — N760 Acute vaginitis: Secondary | ICD-10-CM | POA: Diagnosis present

## 2015-03-28 DIAGNOSIS — O480 Post-term pregnancy: Secondary | ICD-10-CM | POA: Diagnosis present

## 2015-03-28 DIAGNOSIS — IMO0001 Reserved for inherently not codable concepts without codable children: Secondary | ICD-10-CM

## 2015-03-28 LAB — TYPE AND SCREEN
ABO/RH(D): O POS
ANTIBODY SCREEN: NEGATIVE

## 2015-03-28 LAB — CBC
HEMATOCRIT: 34.6 % — AB (ref 36.0–46.0)
Hemoglobin: 11.2 g/dL — ABNORMAL LOW (ref 12.0–15.0)
MCH: 26.7 pg (ref 26.0–34.0)
MCHC: 32.4 g/dL (ref 30.0–36.0)
MCV: 82.4 fL (ref 78.0–100.0)
PLATELETS: 306 10*3/uL (ref 150–400)
RBC: 4.2 MIL/uL (ref 3.87–5.11)
RDW: 14.6 % (ref 11.5–15.5)
WBC: 17.8 10*3/uL — AB (ref 4.0–10.5)

## 2015-03-28 LAB — RPR: RPR Ser Ql: NONREACTIVE

## 2015-03-28 MED ORDER — LANOLIN HYDROUS EX OINT: TOPICAL_OINTMENT | CUTANEOUS | Status: DC | PRN

## 2015-03-28 MED ORDER — OXYCODONE-ACETAMINOPHEN 5-325 MG PO TABS
1.0000 | ORAL_TABLET | ORAL | Status: DC | PRN
  Administered 2015-03-28 (×2): 1 via ORAL
  Filled 2015-03-28 (×2): qty 1

## 2015-03-28 MED ORDER — LIDOCAINE HCL (PF) 1 % IJ SOLN
30.0000 mL | INTRAMUSCULAR | Status: DC | PRN
  Administered 2015-03-28: 30 mL via SUBCUTANEOUS
  Filled 2015-03-28: qty 30

## 2015-03-28 MED ORDER — BENZOCAINE-MENTHOL 20-0.5 % EX AERO
1.0000 | INHALATION_SPRAY | CUTANEOUS | Status: DC | PRN
Start: 2015-03-28 — End: 2015-03-29
  Administered 2015-03-28: 1 via TOPICAL
  Filled 2015-03-28: qty 56

## 2015-03-28 MED ORDER — OXYTOCIN 10 UNIT/ML IJ SOLN: 10.0000 [IU] | Freq: Once | INTRAMUSCULAR | Status: DC | PRN

## 2015-03-28 MED ORDER — WITCH HAZEL-GLYCERIN EX PADS: 1.0000 "application " | MEDICATED_PAD | CUTANEOUS | Status: DC | PRN

## 2015-03-28 MED ORDER — ACETAMINOPHEN 325 MG PO TABS: 650.0000 mg | ORAL_TABLET | ORAL | Status: DC | PRN

## 2015-03-28 MED ORDER — IBUPROFEN 600 MG PO TABS
600.0000 mg | ORAL_TABLET | Freq: Four times a day (QID) | ORAL | Status: DC
  Administered 2015-03-28 – 2015-03-29 (×6): 600 mg via ORAL
  Filled 2015-03-28 (×6): qty 1

## 2015-03-28 MED ORDER — OXYCODONE-ACETAMINOPHEN 5-325 MG PO TABS: 1.0000 | ORAL_TABLET | ORAL | Status: DC | PRN

## 2015-03-28 MED ORDER — OXYCODONE-ACETAMINOPHEN 5-325 MG PO TABS: 2.0000 | ORAL_TABLET | ORAL | Status: DC | PRN

## 2015-03-28 MED ORDER — SIMETHICONE 80 MG PO CHEW: 80.0000 mg | CHEWABLE_TABLET | ORAL | Status: DC | PRN

## 2015-03-28 MED ORDER — DIBUCAINE 1 % RE OINT: 1.0000 "application " | TOPICAL_OINTMENT | RECTAL | Status: DC | PRN

## 2015-03-28 MED ORDER — CITRIC ACID-SODIUM CITRATE 334-500 MG/5ML PO SOLN: 30.0000 mL | ORAL | Status: DC | PRN

## 2015-03-28 NOTE — Progress Notes (Signed)
Patient ID: April Bates, female   DOB: 1980-10-03, 34 y.o.   MRN: [id number] PPD # 1 SVD   S:  Reports feeling well. Desires early discharge home.             Tolerating po/ No nausea or vomiting             Bleeding is spotting             Pain controlled with ibuprofen (OTC)             Up ad lib / ambulatory / voiding without difficulties    Newborn  Information for the patient's newborn:  Zali, Kamaka [[id number]]  female  breast feeding    O:  A & O x 3, in no apparent distress              VS:  Filed Vitals:   03/28/15 1040 03/28/15 1500 03/28/15 1725 03/29/15 0634  BP: 138/77 123/72 108/55 98/62  Pulse: 88 79 88 80  Temp: 98.9 F (37.2 C) 98.4 F (36.9 C) 97.9 F (36.6 C) 98.7 F (37.1 C)  TempSrc: Oral Oral Oral Oral  Resp: Height:      Weight:      SpO2:  97%      LABS:  Recent Labs  03/29/15 0528  WBC 15.3*  HGB 9.7*  HCT 30.3*  PLT 242    Blood type: --/--/O POS (10/11 0455)  Rubella: Immune (02/24 0000)     Lungs: Clear and unlabored  Heart: regular rate and rhythm / no murmurs  Abdomen: soft, non-tender, non-distended              Fundus: firm, non-tender, U-1  Perineum: 2nd degree repair healing well, no edema, ecchymosis or drainage  Lochia: spotting  Extremities: No edema, no calf pain or tenderness, No Homans    A/P: PPD # 1  34 y.o., [id number]   Principal Problem:   Postpartum care following vaginal delivery (10/11) Active Problems:   Active labor at term   Doing well - stable status  Routine post partum orders  Early Discharge today  Chuong Casebeer, M, MSN, CNM 03/30/2015, 8:54 AM

## 2015-03-28 NOTE — Lactation Note (Signed)
This note was copied from the chart of April Bates. Lactation Consultation Note Experienced  BF mom for 19 months for 1st child, 24 months for 2nd child. Had no issues or painful latches. Baby rooting showing feeding cues. Licking lips, moves tongue out of mouth, noted heart shaped tongue, upper lip labial frenulum. Mom stated she didn't think her other children had any issues with that. Encouraged to flange lips and mouth wide for a deep latch. Mom stated she had to do that with her others until they learned to do it. Encouraged STS, comfort during BF, and monitoring I&O. Newborn behavior reviewed, cluster feeding, supply and demand. Mom has good everted nipples and hand expressed colostrum prior to latching. WH/LC brochure given w/resources, support groups and LC services. Patient Name: April Bates [id number] Date: 03/28/2015 Reason for consult: Initial assessment   Maternal Data Has patient been taught Hand Expression?: Yes Does the patient have breastfeeding experience prior to this delivery?: Yes  Feeding Feeding Type: Breast Fed Length of feed: 10 min (still BF)  LATCH Score/Interventions Latch: Grasps breast easily, tongue down, lips flanged, rhythmical sucking. Intervention(s): Breast massage  Audible Swallowing: A few with stimulation Intervention(s): Skin to skin;Hand expression;Alternate breast massage  Type of Nipple: Everted at rest and after stimulation  Comfort (Breast/Nipple): Soft / non-tender     Hold (Positioning): No assistance needed to correctly position infant at breast. Intervention(s): Position options;Skin to skin;Support Pillows;Breastfeeding basics reviewed  LATCH Score: 9  Lactation Tools Discussed/Used     Consult Status Consult Status: PRN    Raynesha Tiedt G 03/28/2015, 1:05 PM

## 2015-03-28 NOTE — MAU Note (Signed)
Pt reports contractions 

## 2015-03-28 NOTE — H&P (Signed)
OB ADMISSION/ HISTORY & PHYSICAL:  Admission Date: 03/28/2015  1:21 AM  Admit Diagnosis: Active Labor  April Bates is a 34 y.o. female presenting for contractions every 5 mins x 2-3 hours.    Prenatal History: [id number]   EDC : 03/21/2015, LMP  Prenatal care at [facility] since 8.[redacted] weeks gestation Primary Care Provider at [facility]: Dr. Billy Coast  Prenatal course complicated by kidney stones  Prenatal Labs: ABO, Rh: O (02/24 0000)  Antibody: PENDING (10/11 0455) Rubella: Immune (02/24 0000)  RPR: Nonreactive (02/24 0000)  HBsAg: Negative (02/24 0000)  HIV: Non-reactive (02/24 0000)  GBS: Negative (09/07 0000)  GTT : Normal - 97   Medical / Surgical History :  Past medical history:  Past Medical History  Diagnosis Date  . IgA nephropathy   . Kidney stones   . Infertility associated with anovulation      Past surgical history:  Past Surgical History  Procedure Laterality Date  . Renal biopsy       Family History: History reviewed. No pertinent family history.   Social History:  reports that she has never smoked. She has never used smokeless tobacco. She reports that she does not drink alcohol or use illicit drugs.   Allergies: Sulfa antibiotics    Current Medications at time of admission:  Prescriptions prior to admission  Medication Sig Dispense Refill Last Dose  . Prenatal Vit-Fe Fumarate-FA (PRENATAL MULTIVITAMIN) TABS Take 1 tablet by mouth daily at 12 noon.   Past Week at Unknown time  . ibuprofen (ADVIL,MOTRIN) 600 MG tablet Take 1 tablet (600 mg total) by mouth every 6 (six) hours as needed for pain. 30 tablet 2   . simethicone (MYLICON) 80 MG chewable tablet Chew 1 tablet (80 mg total) by mouth as needed for flatulence. 30 tablet 2       Review of Systems: Active FM onset of ctx @ 2200 currently every 5 minutes No LOF  bloody show present   Physical Exam: Today's Vitals   03/28/15 0134 03/28/15 0135 03/28/15 0159  BP:   121/80   Pulse:  83   Temp:  98.4 F (36.9 C)   TempSrc:  Oral   Resp:  16   Height:  5' (1.524 m)   Weight:  70.308 kg (155 lb)   SpO2:  100%   PainSc: 6   5    General: A&O x3, NAD Heart: RRR, no murmurs Lungs: unlabored, CTAB Abdomen: Gravid soft, non tender Extremities: Atraumatic, Normal ROM, no edema Genitalia / VE: Dilation: 6 Effacement (%): 90 Station: -2, -3 Exam by: Arita Miss, CNM FHR: 135 bpm / moderate variability / accels present / no decels TOCO: regular every 5 mins  Labs:     Recent Labs  03/28/15 0455  WBC 17.8*  HGB 11.2*  HCT 34.6*  PLT 306     Assessment:  34 y.o. G3P2002 at [redacted]w[redacted]d  1. Labor: Active 2. Fetal Wellbeing: Category 1  3. Pain Control: planning water immersion 4. GBS: Negative  Plan:  1. Admit to BS 2. Routine L&D orders 3. Analgesia/anesthesia PRN     Dr. Billy Coast notified of admission / plan of care    Kenard Gower, MSN, CNM 03/28/2015, 5:45 AM

## 2015-03-28 NOTE — Progress Notes (Signed)
Patient ID: April Bates, female   DOB: Jan 27, 1981, 34 y.o.   MRN: [id number]   Subjective: April Bates is a 34 y.o. G3P3002 at [redacted]w[redacted]d by LMP admitted for active labor.  Objective: Filed Vitals:   03/28/15 0748 03/28/15 0801 03/28/15 0816 03/28/15 0831  BP: 131/66 123/58 137/57 126/89  Pulse: 109 114 104 101  Temp:      TempSrc:      Resp: Height:      Weight:      SpO2:             FHT:  FHR: 145 bpm, variability: moderate,  accelerations:  Present,  decelerations:  Absent UC:   regular, every 5 minutes SVE:   Dilation: 9.5 Effacement (%): 100 Station: +1 BBOW / AROM with small clear fluid Exam by: Deiondra Denley, cnm  Labs:   Recent Labs  03/28/15 0455  WBC 17.8*  HGB 11.2*  HCT 34.6*  PLT 306    Assessment / Plan: Spontaneous labor, progressing normally  Labor: Progressing normally Preeclampsia:  N/A Fetal Wellbeing:  Category I Pain Control:  Labor support without medications and water immersion for pain management only Anticipated MOD:  NSVD  *Dr. Billy Coast updated on progress and patient's request to have him deliver her baby on the bed   Banks Chaikin, M, MSN, CNM 03/28/2015, 6:10 AM

## 2015-03-29 LAB — CBC
HCT: 30.3 % — ABNORMAL LOW (ref 36.0–46.0)
Hemoglobin: 9.7 g/dL — ABNORMAL LOW (ref 12.0–15.0)
MCH: 26.6 pg (ref 26.0–34.0)
MCHC: 32 g/dL (ref 30.0–36.0)
MCV: 83.2 fL (ref 78.0–100.0)
Platelets: 242 10*3/uL (ref 150–400)
RBC: 3.64 MIL/uL — ABNORMAL LOW (ref 3.87–5.11)
RDW: 14.9 % (ref 11.5–15.5)
WBC: 15.3 10*3/uL — ABNORMAL HIGH (ref 4.0–10.5)

## 2015-03-29 MED ORDER — IBUPROFEN 600 MG PO TABS: 600.0000 mg | ORAL_TABLET | Freq: Four times a day (QID) | ORAL | Status: AC

## 2015-03-29 NOTE — Progress Notes (Signed)
Patient ID: April Bates, female   DOB: Mar 25, 1981, 34 y.o.   MRN: [id number] PPD # 1 SVD  S:  Reports feeling well. Desires early discharge today.             Tolerating po/ No nausea or vomiting             Bleeding is spotting             Pain controlled with ibuprofen (OTC) and narcotic analgesics including Percocet             Up ad lib / ambulatory / voiding without difficulties    Newborn  Information for the patient's newborn:  [name], [name] [[id number]][[id number]]  female  breast feeding     O:  A & O x 3, in no apparent distress              VS:  Filed Vitals:   03/28/15 1040 03/28/15 1500 03/28/15 1725 03/29/15 0634  BP: 138/77 123/72 108/55 98/62  Pulse: 88 79 88 80  Temp: 98.9 F (37.2 C) 98.4 F (36.9 C) 97.9 F (36.6 C) 98.7 F (37.1 C)  TempSrc: Oral Oral Oral Oral  Resp: 18 18 18 18   Height:      Weight:      SpO2:  97%      LABS:  Recent Labs  03/28/15 0455 03/29/15 0528  WBC 17.8* 15.3*  HGB 11.2* 9.7*  HCT 34.6* 30.3*  PLT 306 242    Blood type: --/--/O POS (10/11 0455)  Rubella: Immune (02/24 0000)   I&O: I/O last 3 completed shifts: In: -  Out: 132 [Blood:132]             Lungs: Clear and unlabored  Heart: regular rate and rhythm / no murmurs  Abdomen: soft, non-tender, non-distended              Fundus: firm, non-tender, U-1  Perineum: 2nd degree repair healing well  Lochia: minimal  Extremities: No edema, no calf pain or tenderness, No Homans    A/P: PPD # 1  34 y.o., [id number]   Principal Problem:    Postpartum care following vaginal delivery (10/11)  Active Problems:    Active labor at term   Doing well - stable status  Routine post partum orders  Early D/C home today    Arita MissAWSON, Autym Siess, M, MSN, CNM 03/29/2015, 8:50 AM

## 2015-03-29 NOTE — Discharge Instructions (Signed)
Breast Pumping Tips °If you are breastfeeding, there may be times when you cannot feed your baby directly. Returning to work or going on a trip are common examples. Pumping allows you to store breast milk and feed it to your baby later.  °You may not get much milk when you first start to pump. Your breasts should start to make more after a few days. If you pump at the times you usually feed your baby, you may be able to keep making enough milk to feed your baby without also using formula. The more often you pump, the more milk you will produce.  °WHEN SHOULD I PUMP?  °· You can begin to pump soon after delivery. However, some experts recommend waiting about 4 weeks before giving your infant a bottle to make sure breastfeeding is going well.  °· If you plan to return to work, begin pumping a few weeks before. This will help you develop techniques that work best for you. It also lets you build up a supply of breast milk.   °· When you are with your infant, feed on demand and pump after each feeding.   °· When you are away from your infant for several hours, pump for about 15 minutes every 2-3 hours. Pump both breasts at the same time if you can.   °· If your infant has a formula feeding, make sure to pump around the same time.     °· If you drink any alcohol, wait 2 hours before pumping.   °HOW DO I PREPARE TO PUMP? °Your let-down reflex is the natural reaction to stimulation that makes your breast milk flow. It is easier to stimulate this reflex when you are relaxed. Find relaxation techniques that work for you. If you have difficulty with your let-down reflex, try these methods:  °· Smell one of your infant's blankets or an item of clothing.   °· Look at a picture or video of your infant.   °· Sit in a quiet, private space.   °· Massage the breast you plan to pump.   °· Place soothing warmth on the breast.   °· Play relaxing music.   °WHAT ARE SOME GENERAL BREAST PUMPING TIPS? °· Wash your hands before you pump. You  do not need to wash your nipples or breasts. °· There are three ways to pump. °¨ You can use your hand to massage and compress your breast. °¨ You can use a handheld manual pump. °¨ You can use an electric pump.   °· Make sure the suction cup (flange) on the breast pump is the right size. Place the flange directly over the nipple. If it is the wrong size or placed the wrong way, it may be painful and cause nipple damage.   °· If pumping is uncomfortable, apply a small amount of purified or modified lanolin to your nipple and areola. °· If you are using an electric pump, adjust the speed and suction power to be more comfortable. °· If pumping is painful or if you are not getting very much milk, you may need a different type of pump. A lactation consultant can help you determine what type of pump to use.   °· Keep a full water bottle near you at all times. Drinking lots of fluid helps you make more milk.  °· You can store your milk to use later. Pumped breast milk can be stored in a sealable, sterile container or plastic bag. Label all stored breast milk with the date you pumped it. °¨ Milk can stay out at room temperature for up to 8 hours. °¨   You can store your milk in the refrigerator for up to 8 days. °¨ You can store your milk in the freezer for 3 months. Thaw frozen milk using warm water. Do not put it in the microwave. °· Do not smoke. Smoking can lower your milk supply and harm your infant. If you need help quitting, ask your health care provider to recommend a program.   °WHEN SHOULD I CALL MY HEALTH CARE PROVIDER OR A LACTATION CONSULTANT? °· You are having trouble pumping. °· You are concerned that you are not making enough milk. °· You have nipple pain, soreness, or redness. °· You want to use birth control. Birth control pills may lower your milk supply. Talk to your health care provider about your options. °  °This information is not intended to replace advice given to you by your health care provider.  Make sure you discuss any questions you have with your health care provider. °  °Document Released: 11/21/2009 Document Revised: 06/08/2013 Document Reviewed: 03/26/2013 °[employer]vier Interactive Patient Education ©2016 [employer] °Postpartum Depression and Baby Blues °The postpartum period begins right after the birth of a baby. During this time, there is often a great amount of joy and excitement. It is also a time of many changes in the life of the parents. Regardless of how many times a mother gives birth, each child brings new challenges and dynamics to the family. It is not unusual to have feelings of excitement along with confusing shifts in moods, emotions, and thoughts. All mothers are at risk of developing postpartum depression or the "baby blues." These mood changes can occur right after giving birth, or they may occur many months after giving birth. The baby blues or postpartum depression can be mild or severe. Additionally, postpartum depression can go away rather quickly, or it can be a long-term condition.  °CAUSES °Raised hormone levels and the rapid drop in those levels are thought to be a main cause of postpartum depression and the baby blues. A number of hormones change during and after pregnancy. Estrogen and progesterone usually decrease right after the delivery of your baby. The levels of thyroid hormone and various cortisol steroids also rapidly drop. Other factors that play a role in these mood changes include major life events and genetics.  °RISK FACTORS °If you have any of the following risks for the baby blues or postpartum depression, know what symptoms to watch out for during the postpartum period. Risk factors that may increase the likelihood of getting the baby blues or postpartum depression include: °· Having a personal or family history of depression.   °· Having depression while being pregnant.   °· Having premenstrual mood issues or mood issues related to oral  contraceptives. °· Having a lot of life stress.   °· Having marital conflict.   °· Lacking a social support network.   °· Having a baby with special needs.   °· Having health problems, such as diabetes.   °SIGNS AND SYMPTOMS °Symptoms of baby blues include: °· Brief changes in mood, such as going from extreme happiness to sadness. °· Decreased concentration.   °· Difficulty sleeping.   °· Crying spells, tearfulness.   °· Irritability.   °· Anxiety.   °Symptoms of postpartum depression typically begin within the first month after giving birth. These symptoms include: °· Difficulty sleeping or excessive sleepiness.   °· Marked weight loss.   °· Agitation.   °· Feelings of worthlessness.   °· Lack of interest in activity or food.   °Postpartum psychosis is a very serious condition and can be dangerous. Fortunately, it is   rare. Displaying any of the following symptoms is cause for immediate medical attention. Symptoms of postpartum psychosis include:  °· Hallucinations and delusions.   °· Bizarre or disorganized behavior.   °· Confusion or disorientation.   °DIAGNOSIS  °A diagnosis is made by an evaluation of your symptoms. There are no medical or lab tests that lead to a diagnosis, but there are various questionnaires that a health care provider may use to identify those with the baby blues, postpartum depression, or psychosis. Often, a screening tool called the Edinburgh Postnatal Depression Scale is used to diagnose depression in the postpartum period.  °TREATMENT °The baby blues usually goes away on its own in 1-2 weeks. Social support is often all that is needed. You will be encouraged to get adequate sleep and rest. Occasionally, you may be given medicines to help you sleep.  °Postpartum depression requires treatment because it can last several months or longer if it is not treated. Treatment may include individual or group therapy, medicine, or both to address any social, physiological, and psychological factors  that may play a role in the depression. Regular exercise, a healthy diet, rest, and social support may also be strongly recommended.  °Postpartum psychosis is more serious and needs treatment right away. Hospitalization is often needed. °HOME CARE INSTRUCTIONS °· Get as much rest as you can. Nap when the baby sleeps.   °· Exercise regularly. Some women find yoga and walking to be beneficial.   °· Eat a balanced and nourishing diet.   °· Do little things that you enjoy. Have a cup of tea, take a bubble bath, read your favorite magazine, or listen to your favorite music. °· Avoid alcohol.   °· Ask for help with household chores, cooking, grocery shopping, or running errands as needed. Do not try to do everything.   °· Talk to people close to you about how you are feeling. Get support from your partner, family members, friends, or other new moms. °· Try to stay positive in how you think. Think about the things you are grateful for.   °· Do not spend a lot of time alone.   °· Only take over-the-counter or prescription medicine as directed by your health care provider. °· Keep all your postpartum appointments.   °· Let your health care provider know if you have any concerns.   °SEEK MEDICAL CARE IF: °You are having a reaction to or problems with your medicine. °SEEK IMMEDIATE MEDICAL CARE IF: °· You have suicidal feelings.   °· You think you may harm the baby or someone else. °MAKE SURE YOU: °· Understand these instructions. °· Will watch your condition. °· Will get help right away if you are not doing well or get worse. °  °This information is not intended to replace advice given to you by your health care provider. Make sure you discuss any questions you have with your health care provider. °  °Document Released: 03/07/2004 Document Revised: 06/08/2013 Document Reviewed: 03/15/2013 °Elsevier Interactive Patient Education ©2016 [employer] °Postpartum Care After Vaginal Delivery °After you deliver your newborn  (postpartum period), the usual stay in the hospital is 24-72 hours. If there were problems with your labor or delivery, or if you have other medical problems, you might be in the hospital longer.  °While you are in the hospital, you will receive help and instructions on how to care for yourself and your newborn during the postpartum period.  °While you are in the hospital: °· Be sure to tell your nurses if you have pain or discomfort, as well as   where you feel the pain and what makes the pain worse. °· If you had an incision made near your vagina (episiotomy) or if you had some tearing during delivery, the nurses may put ice packs on your episiotomy or tear. The ice packs may help to reduce the pain and swelling. °· If you are breastfeeding, you may feel uncomfortable contractions of your uterus for a couple of weeks. This is normal. The contractions help your uterus get back to normal size. °· It is normal to have some bleeding after delivery. °¨ For the first 1-3 days after delivery, the flow is red and the amount may be similar to a period. °¨ It is common for the flow to start and stop. °¨ In the first few days, you may pass some small clots. Let your nurses know if you begin to pass large clots or your flow increases. °¨ Do not  flush blood clots down the toilet before having the nurse look at them. °¨ During the next 3-10 days after delivery, your flow should become more watery and pink or brown-tinged in color. °¨ Ten to fourteen days after delivery, your flow should be a small amount of yellowish-white discharge. °¨ The amount of your flow will decrease over the first few weeks after delivery. Your flow may stop in 6-8 weeks. Most women have had their flow stop by 12 weeks after delivery. °· You should change your sanitary pads frequently. °· Wash your hands thoroughly with soap and water for at least 20 seconds after changing pads, using the toilet, or before holding or feeding your newborn. °· You should  feel like you need to empty your bladder within the first 6-8 hours after delivery. °· In case you become weak, lightheaded, or faint, call your nurse before you get out of bed for the first time and before you take a shower for the first time. °· Within the first few days after delivery, your breasts may begin to feel tender and full. This is called engorgement. Breast tenderness usually goes away within 48-72 hours after engorgement occurs. You may also notice milk leaking from your breasts. If you are not breastfeeding, do not stimulate your breasts. Breast stimulation can make your breasts produce more milk. °· Spending as much time as possible with your newborn is very important. During this time, you and your newborn can feel close and get to know each other. Having your newborn stay in your room (rooming in) will help to strengthen the bond with your newborn.  It will give you time to get to know your newborn and become comfortable caring for your newborn. °· Your hormones change after delivery. Sometimes the hormone changes can temporarily cause you to feel sad or tearful. These feelings should not last more than a few days. If these feelings last longer than that, you should talk to your caregiver. °· If desired, talk to your caregiver about methods of family planning or contraception. °· Talk to your caregiver about immunizations. Your caregiver may want you to have the following immunizations before leaving the hospital: °¨ Tetanus, diphtheria, and pertussis (Tdap) or tetanus and diphtheria (Td) immunization. It is very important that you and your family (including grandparents) or others caring for your newborn are up-to-date with the Tdap or Td immunizations. The Tdap or Td immunization can help protect your newborn from getting ill. °¨ Rubella immunization. °¨ Varicella (chickenpox) immunization. °¨ Influenza immunization. You should receive this annual immunization if you did not receive the    immunization during your pregnancy. °  °This information is not intended to replace advice given to you by your health care provider. Make sure you discuss any questions you have with your health care provider. °  °Document Released: 03/31/2007 Document Revised: 02/26/2012 Document Reviewed: 01/29/2012 °[employer]vier Interactive Patient Education ©2016 [employer] °Breastfeeding and Mastitis °Mastitis is inflammation of the breast tissue. It can occur in women who are breastfeeding. This can make breastfeeding painful. Mastitis will sometimes go away on its own. Your health care provider will help determine if treatment is needed. °CAUSES °Mastitis is often associated with a blocked milk (lactiferous) duct. This can happen when too much milk builds up in the breast. Causes of excess milk in the breast can include: °· Poor latch-on. If your baby is not latched onto the breast properly, she or he may not empty your breast completely while breastfeeding. °· Allowing too much time to pass between feedings. °· Wearing a bra or other clothing that is too tight. This puts extra pressure on the lactiferous ducts so milk does not flow through them as it should. °Mastitis can also be caused by a bacterial infection. Bacteria may enter the breast tissue through cuts or openings in the skin. In women who are breastfeeding, this may occur because of cracked or irritated skin. Cracks in the skin are often caused when your baby does not latch on properly to the breast. °SIGNS AND SYMPTOMS °· Swelling, redness, tenderness, and pain in an area of the breast. °· Swelling of the glands under the arm on the same side. °· Fever may or may not accompany mastitis. °If an infection is allowed to progress, a collection of pus (abscess) may develop. °DIAGNOSIS  °Your health care provider can usually diagnose mastitis based on your symptoms and a physical exam. Tests may be done to help confirm the diagnosis. These may include: °· Removal of pus  from the breast by applying pressure to the area. This pus can be examined in the lab to determine which bacteria are present. If an abscess has developed, the fluid in the abscess can be removed with a needle. This can also be used to confirm the diagnosis and determine the bacteria present. In most cases, pus will not be present. °· Blood tests to determine if your body is fighting a bacterial infection. °· Mammogram or ultrasound tests to rule out other problems or diseases. °TREATMENT  °Mastitis that occurs with breastfeeding will sometimes go away on its own. Your health care provider may choose to wait 24 hours after first seeing you to decide whether a prescription medicine is needed. If your symptoms are worse after 24 hours, your health care provider will likely prescribe an antibiotic medicine to treat the mastitis. He or she will determine which bacteria are most likely causing the infection and will then select an appropriate antibiotic medicine. This is sometimes changed based on the results of tests performed to identify the bacteria, or if there is no response to the antibiotic medicine selected. Antibiotic medicines are usually given by mouth. You may also be given medicine for pain. °HOME CARE INSTRUCTIONS °· Only take over-the-counter or prescription medicines for pain, fever, or discomfort as directed by your health care provider. °· If your health care provider prescribed an antibiotic medicine, take the medicine as directed. Make sure you finish it even if you start to feel better. °· Do not wear a tight or underwire bra. Wear a soft, supportive bra. °· Increase your fluid   intake, especially if you have a fever. °· Continue to empty the breast. Your health care provider can tell you whether this milk is safe for your infant or needs to be thrown out. You may be told to stop nursing until your health care provider thinks it is safe for your baby. Use a breast pump if you are advised to stop  nursing. °· Keep your nipples clean and dry. °· Empty the first breast completely before going to the other breast. If your baby is not emptying your breasts completely for some reason, use a breast pump to empty your breasts. °· If you go back to work, pump your breasts while at work to stay in time with your nursing schedule. °· Avoid allowing your breasts to become overly filled with milk (engorged). °SEEK MEDICAL CARE IF: °· You have pus-like discharge from the breast. °· Your symptoms do not improve with the treatment prescribed by your health care provider within 2 days. °SEEK IMMEDIATE MEDICAL CARE IF: °· Your pain and swelling are getting worse. °· You have pain that is not controlled with medicine. °· You have a red line extending from the breast toward your armpit. °· You have a fever or persistent symptoms for more than 2-3 days. °· You have a fever and your symptoms suddenly get worse. °MAKE SURE YOU:  °· Understand these instructions. °· Will watch your condition. °· Will get help right away if you are not doing well or get worse. °  °This information is not intended to replace advice given to you by your health care provider. Make sure you discuss any questions you have with your health care provider. °  °Document Released: 09/28/2004 Document Revised: 06/08/2013 Document Reviewed: 01/07/2013 °[employer]vier Interactive Patient Education ©2016 [employer] ° °Breastfeeding °Deciding to breastfeed is one of the best choices you can make for you and your baby. A change in hormones during pregnancy causes your breast tissue to grow and increases the number and size of your milk ducts. These hormones also allow proteins, sugars, and fats from your blood supply to make breast milk in your milk-producing glands. Hormones prevent breast milk from being released before your baby is born as well as prompt milk flow after birth. Once breastfeeding has begun, thoughts of your baby, as well as his or her sucking or  crying, can stimulate the release of milk from your milk-producing glands.  °BENEFITS OF BREASTFEEDING °For Your Baby °· Your first milk (colostrum) helps your baby's digestive system function better. °· There are antibodies in your milk that help your baby fight off infections. °· Your baby has a lower incidence of asthma, allergies, and sudden infant death syndrome. °· The nutrients in breast milk are better for your baby than infant formulas and are designed uniquely for your baby's needs. °· Breast milk improves your baby's brain development. °· Your baby is less likely to develop other conditions, such as childhood obesity, asthma, or type 2 diabetes mellitus. °For You °· Breastfeeding helps to create a very special bond between you and your baby. °· Breastfeeding is convenient. Breast milk is always available at the correct temperature and costs nothing. °· Breastfeeding helps to burn calories and helps you lose the weight gained during pregnancy. °· Breastfeeding makes your uterus contract to its prepregnancy size faster and slows bleeding (lochia) after you give birth.   °· Breastfeeding helps to lower your risk of developing type 2 diabetes mellitus, osteoporosis, and breast or ovarian cancer later in life. °  SIGNS THAT YOUR BABY IS HUNGRY °Early Signs of Hunger °· Increased alertness or activity. °· Stretching. °· Movement of the head from side to side. °· Movement of the head and opening of the mouth when the corner of the mouth or cheek is stroked (rooting). °· Increased sucking sounds, smacking lips, cooing, sighing, or squeaking. °· Hand-to-mouth movements. °· Increased sucking of fingers or hands. °Late Signs of Hunger °· Fussing. °· Intermittent crying. °Extreme Signs of Hunger °Signs of extreme hunger will require calming and consoling before your baby will be able to breastfeed successfully. Do not wait for the following signs of extreme hunger to occur before you initiate  breastfeeding: °· Restlessness. °· A loud, strong cry. °· Screaming. °BREASTFEEDING BASICS °Breastfeeding Initiation °· Find a comfortable place to sit or lie down, with your neck and back well supported. °· Place a pillow or rolled up blanket under your baby to bring him or her to the level of your breast (if you are seated). Nursing pillows are specially designed to help support your arms and your baby while you breastfeed. °· Make sure that your baby's abdomen is facing your abdomen. °· Gently massage your breast. With your fingertips, massage from your chest wall toward your nipple in a circular motion. This encourages milk flow. You may need to continue this action during the feeding if your milk flows slowly. °· Support your breast with 4 fingers underneath and your thumb above your nipple. Make sure your fingers are well away from your nipple and your baby's mouth. °· Stroke your baby's lips gently with your finger or nipple. °· When your baby's mouth is open wide enough, quickly bring your baby to your breast, placing your entire nipple and as much of the colored area around your nipple (areola) as possible into your baby's mouth. °¨ More areola should be visible above your baby's upper lip than below the lower lip. °¨ Your baby's tongue should be between his or her lower gum and your breast. °· Ensure that your baby's mouth is correctly positioned around your nipple (latched). Your baby's lips should create a seal on your breast and be turned out (everted). °· It is common for your baby to suck about 2-3 minutes in order to start the flow of breast milk. °Latching °Teaching your baby how to latch on to your breast properly is very important. An improper latch can cause nipple pain and decreased milk supply for you and poor weight gain in your baby. Also, if your baby is not latched onto your nipple properly, he or she may swallow some air during feeding. This can make your baby fussy. Burping your baby when  you switch breasts during the feeding can help to get rid of the air. However, teaching your baby to latch on properly is still the best way to prevent fussiness from swallowing air while breastfeeding. °Signs that your baby has successfully latched on to your nipple: °· Silent tugging or silent sucking, without causing you pain. °· Swallowing heard between every 3-4 sucks. °· Muscle movement above and in front of his or her ears while sucking. °Signs that your baby has not successfully latched on to nipple: °· Sucking sounds or smacking sounds from your baby while breastfeeding. °· Nipple pain. °If you think your baby has not latched on correctly, slip your finger into the corner of your baby's mouth to break the suction and place it between your baby's gums. Attempt breastfeeding initiation again. °Signs of Successful Breastfeeding °  Signs from your baby: °· A gradual decrease in the number of sucks or complete cessation of sucking. °· Falling asleep. °· Relaxation of his or her body. °· Retention of a small amount of milk in his or her mouth. °· Letting go of your breast by himself or herself. °Signs from you: °· Breasts that have increased in firmness, weight, and size 1-3 hours after feeding. °· Breasts that are softer immediately after breastfeeding. °· Increased milk volume, as well as a change in milk consistency and color by the fifth day of breastfeeding. °· Nipples that are not sore, cracked, or bleeding. °Signs That Your Baby is Getting Enough Milk °· Wetting at least 3 diapers in a 24-hour period. The urine should be clear and pale yellow by age 5 days. °· At least 3 stools in a 24-hour period by age 5 days. The stool should be soft and yellow. °· At least 3 stools in a 24-hour period by age 7 days. The stool should be seedy and yellow. °· No loss of weight greater than 10% of birth weight during the first 3 days of age. °· Average weight gain of 4-7 ounces (113-198 g) per week after age 4  days. °· Consistent daily weight gain by age 5 days, without weight loss after the age of 2 weeks. °After a feeding, your baby may spit up a small amount. This is common. °BREASTFEEDING FREQUENCY AND DURATION °Frequent feeding will help you make more milk and can prevent sore nipples and breast engorgement. Breastfeed when you feel the need to reduce the fullness of your breasts or when your baby shows signs of hunger. This is called "breastfeeding on demand." Avoid introducing a pacifier to your baby while you are working to establish breastfeeding (the first 4-6 weeks after your baby is born). After this time you may choose to use a pacifier. Research has shown that pacifier use during the first year of a baby's life decreases the risk of sudden infant death syndrome (SIDS). °Allow your baby to feed on each breast as long as he or she wants. Breastfeed until your baby is finished feeding. When your baby unlatches or falls asleep while feeding from the first breast, offer the second breast. Because newborns are often sleepy in the first few weeks of life, you may need to awaken your baby to get him or her to feed. °Breastfeeding times will vary from baby to baby. However, the following rules can serve as a guide to help you ensure that your baby is properly fed: °· Newborns (babies 4 weeks of age or younger) may breastfeed every 1-3 hours. °· Newborns should not go longer than 3 hours during the day or 5 hours during the night without breastfeeding. °· You should breastfeed your baby a minimum of 8 times in a 24-hour period until you begin to introduce solid foods to your baby at around 6 months of age. °BREAST MILK PUMPING °Pumping and storing breast milk allows you to ensure that your baby is exclusively fed your breast milk, even at times when you are unable to breastfeed. This is especially important if you are going back to work while you are still breastfeeding or when you are not able to be present during  feedings. Your lactation consultant can give you guidelines on how long it is safe to store breast milk. °A breast pump is a machine that allows you to pump milk from your breast into a sterile bottle. The pumped breast milk can   then be stored in a refrigerator or freezer. Some breast pumps are operated by hand, while others use electricity. Ask your lactation consultant which type will work best for you. Breast pumps can be purchased, but some hospitals and breastfeeding support groups lease breast pumps on a monthly basis. A lactation consultant can teach you how to hand express breast milk, if you prefer not to use a pump. °CARING FOR YOUR BREASTS WHILE YOU BREASTFEED °Nipples can become dry, cracked, and sore while breastfeeding. The following recommendations can help keep your breasts moisturized and healthy: °· Avoid using soap on your nipples. °· Wear a supportive bra. Although not required, special nursing bras and tank tops are designed to allow access to your breasts for breastfeeding without taking off your entire bra or top. Avoid wearing underwire-style bras or extremely tight bras. °· Air dry your nipples for 3-4 minutes after each feeding. °· Use only cotton bra pads to absorb leaked breast milk. Leaking of breast milk between feedings is normal. °· Use lanolin on your nipples after breastfeeding. Lanolin helps to maintain your skin's normal moisture barrier. If you use pure lanolin, you do not need to wash it off before feeding your baby again. Pure lanolin is not toxic to your baby. You may also hand express a few drops of breast milk and gently massage that milk into your nipples and allow the milk to air dry. °In the first few weeks after giving birth, some women experience extremely full breasts (engorgement). Engorgement can make your breasts feel heavy, warm, and tender to the touch. Engorgement peaks within 3-5 days after you give birth. The following recommendations can help ease  engorgement: °· Completely empty your breasts while breastfeeding or pumping. You may want to start by applying warm, moist heat (in the shower or with warm water-soaked hand towels) just before feeding or pumping. This increases circulation and helps the milk flow. If your baby does not completely empty your breasts while breastfeeding, pump any extra milk after he or she is finished. °· Wear a snug bra (nursing or regular) or tank top for 1-2 days to signal your body to slightly decrease milk production. °· Apply ice packs to your breasts, unless this is too uncomfortable for you. °· Make sure that your baby is latched on and positioned properly while breastfeeding. °If engorgement persists after 48 hours of following these recommendations, contact your health care provider or a lactation consultant. °OVERALL HEALTH CARE RECOMMENDATIONS WHILE BREASTFEEDING °· Eat healthy foods. Alternate between meals and snacks, eating 3 of each per day. Because what you eat affects your breast milk, some of the foods may make your baby more irritable than usual. Avoid eating these foods if you are sure that they are negatively affecting your baby. °· Drink milk, fruit juice, and water to satisfy your thirst (about 10 glasses a day). °· Rest often, relax, and continue to take your prenatal vitamins to prevent fatigue, stress, and anemia. °· Continue breast self-awareness checks. °· Avoid chewing and smoking tobacco. Chemicals from cigarettes that pass into breast milk and exposure to secondhand smoke may harm your baby. °· Avoid alcohol and drug use, including marijuana. °Some medicines that may be harmful to your baby can pass through breast milk. It is important to ask your health care provider before taking any medicine, including all over-the-counter and prescription medicine as well as vitamin and herbal supplements. °It is possible to become pregnant while breastfeeding. If birth control is desired, ask your health care    provider about options that will be safe for your baby. °SEEK MEDICAL CARE IF: °· You feel like you want to stop breastfeeding or have become frustrated with breastfeeding. °· You have painful breasts or nipples. °· Your nipples are cracked or bleeding. °· Your breasts are red, tender, or warm. °· You have a swollen area on either breast. °· You have a fever or chills. °· You have nausea or vomiting. °· You have drainage other than breast milk from your nipples. °· Your breasts do not become full before feedings by the fifth day after you give birth. °· You feel sad and depressed. °· Your baby is too sleepy to eat well. °· Your baby is having trouble sleeping.   °· Your baby is wetting less than 3 diapers in a 24-hour period. °· Your baby has less than 3 stools in a 24-hour period. °· Your baby's skin or the white part of his or her eyes becomes yellow.   °· Your baby is not gaining weight by 5 days of age. °SEEK IMMEDIATE MEDICAL CARE IF: °· Your baby is overly tired (lethargic) and does not want to wake up and feed. °· Your baby develops an unexplained fever. °  °This information is not intended to replace advice given to you by your health care provider. Make sure you discuss any questions you have with your health care provider. °  °Document Released: 06/03/2005 Document Revised: 02/22/2015 Document Reviewed: 11/25/2012 °Elsevier Interactive Patient Education ©2016 [employer] ° °

## 2015-03-29 NOTE — Lactation Note (Signed)
This note was copied from the chart of April Bates Wallen. Lactation Consultation Note  Patient Name: April Bates Shelnutt ZOXWR'UToday's Date: 03/29/2015 Reason for consult: Follow-up assessment;Other (Comment) (baby AB+ . Mom o+ , and + combs ) Baby 29 hours old and was placed on Double Photo this am.  Breast feeding consistently both breast . LC observed 2 different latches and mom independent with latch. LC recommended due to Bili blankets making the baby so warm , may make the baby sleepy. Therefore to latch the baby 1st then then apply on light underneath the baby and one on top. When the baby finishes  To place the baby back in the Bili covering to hold the blankets in place. Voids and stools are adequate for age. Breastfeeding consistently and cluster feeding.  LC reviewed tx for engorgement and importance of feeding every 2-3 hours, Extra post pumping is indicated. DEBP set up with instructions , mom re-latched so unable to watch mom pump. Per mom familiar with the Medela  Set up due to having 2 other babies. LC recommended post pumping both breast after 5-6 feedings 15 - 20 mins on the  premie mode , and when obtaining 20 ml x 3 , switch to standard and can bring pumping down to  10-15 mins. Save milk  And mom can be shown how to spoon feed or syringe feed.  Mom aware to call for assistance as needed.   Maternal Data    Feeding Feeding Type:  (baby latched at the breast, multiply swallows ) Length of feed: 15 min (start time per mom, LC observed the baby already latched )  LATCH Score/Interventions Latch:  (latched with depth )  Audible Swallowing:  (multiply swallows noted )  Type of Nipple:  (nipple well rounded after baby released)  Comfort (Breast/Nipple):  (per mom comfortable )     Hold (Positioning):  (mom independent with latch ) Intervention(s): Breastfeeding basics reviewed;Support Pillows;Position options;Skin to skin  LATCH Score: 9  Lactation Tools  Discussed/Used Tools: Pump Breast pump type: Double-Electric Breast Pump WIC Program: No Pump Review: Setup, frequency, and cleaning;Milk Storage Initiated by:: MAI  Date initiated:: 03/29/15   Consult Status Consult Status: Follow-up Date: 03/30/15 Follow-up type: In-patient    Kathrin Greathouseorio, Bernerd Terhune Ann 03/29/2015, 12:28 PM

## 2015-03-29 NOTE — Discharge Summary (Signed)
OB Discharge Summary     Patient Name: April FosterJennifer Bates DOB: 1980-06-19 MRN: [id number]  Date of admission: 03/28/2015 Delivering MD: April MoraAWSON, Jeevan Bates   Date of discharge: 03/29/2015  Admitting diagnosis: 41 WEEKS CTX Intrauterine pregnancy: 7928w0d         Discharge diagnosis: Postterm Pregnancy - delievered                                                                                                Post partum procedures:none  Augmentation: N/A  Complications: None  Hospital course:  Onset of Labor With Vaginal Delivery     34 y.o. yo [id number] at 6028w0d was admitted in Active Laboron 03/28/2015. Patient had an uncomplicated labor course as follows:  Membrane Rupture Time/Date: 6:01 AM ,03/28/2015   Intrapartum Procedures: Episiotomy: None [1]                                         Lacerations:  2nd degree [3];Perineal [11]  Patient had a delivery of a Viable infant. 03/28/2015  Information for the patient's newborn:  April Bates [[id number]][[id number]]  Delivery Method: Vaginal, Spontaneous Delivery (Filed from Delivery Summary)    Pateint had an uncomplicated postpartum course.  She is ambulating, tolerating a regular diet, passing flatus, and urinating well. Patient is discharged home in stable condition on 03/29/2015  2:12 PM.    Physical exam  Filed Vitals:   03/28/15 1040 03/28/15 1500 03/28/15 1725 03/29/15 0634  BP: 138/77 123/72 108/55 98/62  Pulse: 88 79 88 80  Temp: 98.9 F (37.2 C) 98.4 F (36.9 C) 97.9 F (36.6 C) 98.7 F (37.1 C)  TempSrc: Oral Oral Oral Oral  Resp: 18 18 18 18   Height:      Weight:      SpO2:  97%     General: alert, cooperative and no distress Lochia: appropriate Uterine Fundus: firm Perineum Healing well with no significant drainage, No significant erythema DVT Evaluation: No evidence of DVT seen on physical exam. Negative Homan's sign. No cords or calf tenderness. No significant calf/ankle edema. Labs: Lab Results   Component Value Date   WBC 15.3* 03/29/2015   HGB 9.7* 03/29/2015   HCT 30.3* 03/29/2015   MCV 83.2 03/29/2015   PLT 242 03/29/2015   CMP Latest Ref Rng 08/27/2012  Glucose 70 - 99 mg/dL 83  BUN 6 - 23 mg/dL 14  Creatinine 1.300.50 - 8.651.10 mg/dL 7.840.67  Sodium 696135 - 295145 mEq/L 138  Potassium 3.5 - 5.1 mEq/L 3.9  Chloride 96 - 112 mEq/L 104  CO2 19 - 32 mEq/L 21  Calcium 8.4 - 10.5 mg/dL 9.5  Total Protein 6.0 - 8.3 g/dL 6.9  Total Bilirubin 0.3 - 1.2 mg/dL 0.5  Alkaline Phos 39 - 117 U/L 146(H)  AST 0 - 37 U/L 18  ALT 0 - 35 U/L 10    Discharge instruction: per After Visit Summary and "Baby and Me Booklet".  Medications:  Current facility-administered medications:  .  acetaminophen (TYLENOL)  tablet 650 mg, 650 mg, Oral, Q4H PRN, April Bates, Bates .  benzocaine-Menthol (DERMOPLAST) 20-0.5 % topical spray 1 application, 1 application, Topical, PRN, April Bates, Bates, 1 application at 03/28/15 1723 .  witch hazel-glycerin (TUCKS) pad 1 application, 1 application, Topical, PRN **AND** dibucaine (NUPERCAINAL) 1 % rectal ointment 1 application, 1 application, Rectal, PRN, April Bates, Bates .  ibuprofen (ADVIL,MOTRIN) tablet 600 mg, 600 mg, Oral, 4 times per day, April Bates, Bates, 600 mg at 03/29/15 0609 .  lanolin ointment, , Topical, PRN, April Bates, Bates .  lidocaine (PF) (XYLOCAINE) 1 % injection 30 mL, 30 mL, Subcutaneous, PRN, April Bates, Bates, 30 mL at 03/28/15 0725 .  oxyCODONE-acetaminophen (PERCOCET/ROXICET) 5-325 MG per tablet 1 tablet, 1 tablet, Oral, Q4H PRN, April Bates, Bates, 1 tablet at 03/28/15 1520 .  oxyCODONE-acetaminophen (PERCOCET/ROXICET) 5-325 MG per tablet 2 tablet, 2 tablet, Oral, Q4H PRN, April Bates, Bates .  simethicone (MYLICON) chewable tablet 80 mg, 80 mg, Oral, PRN, April Bates, Bates  Diet: routine diet  Activity: Advance as tolerated. Pelvic rest for 6 weeks.   Outpatient follow up:6 weeks  Postpartum contraception: Undecided  Newborn Data: Live  born female on 03/28/2015 Birth Weight: 7 lb 6.5 oz (3360 g) APGAR: 7, 8  Baby Feeding: Breast Disposition:home with mother   03/29/2015 9:47 AM April Bates

## 2015-03-30 ENCOUNTER — Ambulatory Visit: Payer: Self-pay

## 2015-03-30 NOTE — Lactation Note (Signed)
This note was copied from the chart of Girl Linward FosterJennifer Apsey. Lactation Consultation Note  Patient Name: Girl Linward FosterJennifer Pazos WUJWJ'XToday's Date: 03/30/2015 Reason for consult: Follow-up assessment   With this experienced breastfeeding mom and term baby, now 1650 hours old. Mom reports breastfeeding going well, and she feels her milk is transitioning in. With hand expression, has milk is easily expressed, and appears transitional. The baby just had phototherapy discontinued, and will have a repeat bili  At 1500. If this is good, the baby will be discharged to home at this time.    Maternal Data    Feeding Feeding Type: Breast Milk Length of feed: 20 min  LATCH Score/Interventions                      Lactation Tools Discussed/Used     Consult Status Consult Status: Complete Follow-up type: Call as needed    Alfred LevinsLee, Eevie Lapp Anne 03/30/2015, 9:36 AM

## 2018-08-12 ENCOUNTER — Emergency Department (HOSPITAL_BASED_OUTPATIENT_CLINIC_OR_DEPARTMENT_OTHER): Payer: BLUE CROSS/BLUE SHIELD

## 2018-08-12 ENCOUNTER — Other Ambulatory Visit: Payer: Self-pay

## 2018-08-12 ENCOUNTER — Encounter (HOSPITAL_BASED_OUTPATIENT_CLINIC_OR_DEPARTMENT_OTHER): Payer: Self-pay

## 2018-08-12 ENCOUNTER — Emergency Department (HOSPITAL_BASED_OUTPATIENT_CLINIC_OR_DEPARTMENT_OTHER)
Admission: EM | Admit: 2018-08-12 | Discharge: 2018-08-12 | Disposition: A | Discharge status: Home / Self Care | Payer: BLUE CROSS/BLUE SHIELD | Attending: Emergency Medicine | Admitting: Emergency Medicine

## 2018-08-12 DIAGNOSIS — M542 Cervicalgia: Secondary | ICD-10-CM | POA: Insufficient documentation

## 2018-08-12 DIAGNOSIS — Y92410 Unspecified street and highway as the place of occurrence of the external cause: Secondary | ICD-10-CM | POA: Diagnosis not present

## 2018-08-12 DIAGNOSIS — F1210 Cannabis abuse, uncomplicated: Secondary | ICD-10-CM | POA: Insufficient documentation

## 2018-08-12 DIAGNOSIS — Y998 Other external cause status: Secondary | ICD-10-CM | POA: Insufficient documentation

## 2018-08-12 DIAGNOSIS — R51 Headache: Secondary | ICD-10-CM | POA: Diagnosis not present

## 2018-08-12 DIAGNOSIS — Y9389 Activity, other specified: Secondary | ICD-10-CM | POA: Insufficient documentation

## 2018-08-12 MED ORDER — METHOCARBAMOL 500 MG PO TABS: 500.0000 mg | ORAL_TABLET | Freq: Two times a day (BID) | ORAL | 0 refills | Status: AC

## 2018-08-12 MED ORDER — NAPROXEN 500 MG PO TABS: 500.0000 mg | ORAL_TABLET | Freq: Two times a day (BID) | ORAL | 0 refills | Status: AC
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
## 2018-08-24 DIAGNOSIS — Z01419 Encounter for gynecological examination (general) (routine) without abnormal findings: Secondary | ICD-10-CM | POA: Diagnosis not present

## 2018-08-24 DIAGNOSIS — Z6823 Body mass index (BMI) 23.0-23.9, adult: Secondary | ICD-10-CM | POA: Diagnosis not present

## 2019-07-26 DIAGNOSIS — F411 Generalized anxiety disorder: Secondary | ICD-10-CM | POA: Diagnosis not present

## 2019-09-13 DIAGNOSIS — Z01419 Encounter for gynecological examination (general) (routine) without abnormal findings: Secondary | ICD-10-CM | POA: Diagnosis not present

## 2019-09-13 DIAGNOSIS — Z6824 Body mass index (BMI) 24.0-24.9, adult: Secondary | ICD-10-CM | POA: Diagnosis not present

## 2019-12-12 ENCOUNTER — Emergency Department (HOSPITAL_COMMUNITY)
Admission: EM | Admit: 2019-12-12 | Discharge: 2019-12-12 | Disposition: A | Discharge status: Home / Self Care | Payer: BC Managed Care – PPO | Attending: Emergency Medicine | Admitting: Emergency Medicine

## 2019-12-12 ENCOUNTER — Encounter (HOSPITAL_COMMUNITY): Payer: Self-pay | Admitting: Emergency Medicine

## 2019-12-12 ENCOUNTER — Other Ambulatory Visit: Payer: Self-pay

## 2019-12-12 ENCOUNTER — Emergency Department (HOSPITAL_COMMUNITY): Payer: BC Managed Care – PPO

## 2019-12-12 DIAGNOSIS — K8020 Calculus of gallbladder without cholecystitis without obstruction: Secondary | ICD-10-CM | POA: Diagnosis not present

## 2019-12-12 DIAGNOSIS — R1033 Periumbilical pain: Secondary | ICD-10-CM | POA: Insufficient documentation

## 2019-12-12 DIAGNOSIS — R112 Nausea with vomiting, unspecified: Secondary | ICD-10-CM | POA: Insufficient documentation

## 2019-12-12 DIAGNOSIS — Z79899 Other long term (current) drug therapy: Secondary | ICD-10-CM | POA: Diagnosis not present

## 2019-12-12 DIAGNOSIS — R1011 Right upper quadrant pain: Secondary | ICD-10-CM

## 2019-12-12 DIAGNOSIS — R197 Diarrhea, unspecified: Secondary | ICD-10-CM | POA: Diagnosis not present

## 2019-12-12 DIAGNOSIS — R1031 Right lower quadrant pain: Secondary | ICD-10-CM | POA: Diagnosis not present

## 2019-12-12 DIAGNOSIS — R9431 Abnormal electrocardiogram [ECG] [EKG]: Secondary | ICD-10-CM | POA: Diagnosis not present

## 2019-12-12 LAB — COMPREHENSIVE METABOLIC PANEL
ALT: 23 U/L (ref 0–44)
AST: 23 U/L (ref 15–41)
Albumin: 4 g/dL (ref 3.5–5.0)
Alkaline Phosphatase: 40 U/L (ref 38–126)
Anion gap: 10 (ref 5–15)
BUN: 14 mg/dL (ref 6–20)
CO2: 24 mmol/L (ref 22–32)
Calcium: 8.9 mg/dL (ref 8.9–10.3)
Chloride: 106 mmol/L (ref 98–111)
Creatinine, Ser: 0.68 mg/dL (ref 0.44–1.00)
GFR calc Af Amer: >60 mL/min (ref >60)
GFR calc non Af Amer: >60 mL/min (ref >60)
Glucose, Bld: 121 mg/dL — ABNORMAL HIGH (ref 70–99)
Potassium: 3.8 mmol/L (ref 3.5–5.1)
Sodium: 140 mmol/L (ref 135–145)
Total Bilirubin: 1.5 mg/dL — ABNORMAL HIGH (ref 0.3–1.2)
Total Protein: 7.3 g/dL (ref 6.5–8.1)

## 2019-12-12 LAB — CBC
HCT: 47.2 % — ABNORMAL HIGH (ref 36.0–46.0)
Hemoglobin: 15.2 g/dL — ABNORMAL HIGH (ref 12.0–15.0)
MCH: 29.5 pg (ref 26.0–34.0)
MCHC: 32.2 g/dL (ref 30.0–36.0)
MCV: 91.7 fL (ref 80.0–100.0)
Platelets: 293 10*3/uL (ref 150–400)
RBC: 5.15 MIL/uL — ABNORMAL HIGH (ref 3.87–5.11)
RDW: 12.7 % (ref 11.5–15.5)
WBC: 13.8 10*3/uL — ABNORMAL HIGH (ref 4.0–10.5)
nRBC: 0 % (ref 0.0–0.2)

## 2019-12-12 LAB — URINALYSIS, ROUTINE W REFLEX MICROSCOPIC
Bacteria, UA: NONE SEEN
Bilirubin Urine: NEGATIVE
Glucose, UA: NEGATIVE mg/dL
Ketones, ur: 20 mg/dL — AB
Leukocytes,Ua: NEGATIVE
Nitrite: NEGATIVE
Protein, ur: NEGATIVE mg/dL
Specific Gravity, Urine: 1.027 (ref 1.005–1.030)
pH: 5 (ref 5.0–8.0)

## 2019-12-12 LAB — LIPASE, BLOOD: Lipase: 24 U/L (ref 11–51)

## 2019-12-12 LAB — I-STAT BETA HCG BLOOD, ED (MC, WL, AP ONLY): I-stat hCG, quantitative: <5 m[IU]/mL (ref <5)

## 2019-12-12 MED ORDER — KETOROLAC TROMETHAMINE 30 MG/ML IJ SOLN
30.0000 mg | Freq: Once | INTRAMUSCULAR | Status: AC
  Administered 2019-12-12: 30 mg via INTRAVENOUS
  Filled 2019-12-12: qty 1

## 2019-12-12 MED ORDER — ONDANSETRON HCL 4 MG/2ML IJ SOLN
4.0000 mg | Freq: Once | INTRAMUSCULAR | Status: AC
  Administered 2019-12-12: 4 mg via INTRAVENOUS
  Filled 2019-12-12: qty 2

## 2019-12-12 MED ORDER — DICYCLOMINE HCL 10 MG/ML IM SOLN
20.0000 mg | Freq: Once | INTRAMUSCULAR | Status: AC
  Administered 2019-12-12: 20 mg via INTRAMUSCULAR
  Filled 2019-12-12: qty 2

## 2019-12-12 MED ORDER — SODIUM CHLORIDE 0.9 % IV BOLUS
1000.0000 mL | Freq: Once | INTRAVENOUS | Status: AC
  Administered 2019-12-12: 1000 mL via INTRAVENOUS

## 2019-12-12 MED ORDER — IOHEXOL 300 MG/ML  SOLN
100.0000 mL | Freq: Once | INTRAMUSCULAR | Status: AC | PRN
  Administered 2019-12-12: 100 mL via INTRAVENOUS

## 2019-12-12 MED ORDER — FAMOTIDINE IN NACL 20-0.9 MG/50ML-% IV SOLN
20.0000 mg | Freq: Once | INTRAVENOUS | Status: AC
  Administered 2019-12-12: 20 mg via INTRAVENOUS
  Filled 2019-12-12: qty 50

## 2019-12-12 MED ORDER — SODIUM CHLORIDE 0.9% FLUSH
3.0000 mL | Freq: Once | INTRAVENOUS | Status: AC
  Administered 2019-12-12: 3 mL via INTRAVENOUS

## 2019-12-12 MED ORDER — HYDROCODONE-ACETAMINOPHEN 5-325 MG PO TABS: 1.0000 | ORAL_TABLET | Freq: Three times a day (TID) | ORAL | 0 refills | Status: AC | PRN

## 2019-12-12 MED ORDER — ONDANSETRON HCL 4 MG PO TABS: 4.0000 mg | ORAL_TABLET | Freq: Four times a day (QID) | ORAL | 0 refills | Status: AC

## 2019-12-12 NOTE — Discharge Instructions (Addendum)
Your CT scan did not show any evidence of appendicitis. There is a possibility that you have a small kidney stone.  To treat this I would recommend taking 600 mg of ibuprofen every 6 hours.  I will also give you a prescription for Zofran to help with any nausea or vomiting.  If you have breakthrough pain you can take 1 tablet of Norco every 8 hours.  Do not drive, work or operate machinery while taking this medication as it can make you very sleepy.  Please follow up with your primary doctor within the next 5-7 days.  If you do not have a primary care provider, information for a healthcare clinic has been provided for you to make arrangements for follow up care. Please return to the ER sooner if you have any new or worsening symptoms, or if you have any of the following symptoms:  Abdominal pain that does not go away.  You have a fever.  You keep throwing up (vomiting).  The pain is felt only in portions of the abdomen. Pain in the right side could possibly be appendicitis. In an adult, pain in the left lower portion of the abdomen could be colitis or diverticulitis.  You pass bloody or black tarry stools.  There is bright red blood in the stool.  The constipation stays for more than 4 days.  There is belly (abdominal) or rectal pain.  You do not seem to be getting better.  You have any questions or concerns.

## 2019-12-12 NOTE — ED Provider Notes (Signed)
[facility] EMERGENCY DEPARTMENT Provider Note   CSN: [id number] Arrival date & time: 12/12/19  1516     History Chief Complaint  Patient presents with  . Abdominal Pain    April Bates is a 39 y.o. female.  HPI   Pt is a 39 y/o with a h/o nephrolithiasis, who presents to the ED today c/o periumbilical pain that started at 4:00AM this morning. Pain has been intermittent since onset.  She reports associated nausea, vomiting x6, and diarrhea x6-7 Denies hematemesis, hematochezia. Also reports sweats and subjective fevers. Denies dysuria, frequency, urgency, or hematuria.   Denies eating any suspect foods. States she has 3 children and ate the same food as them but none of her children have any similar sxs.  She tried taking over the counter nausea meds and vomiting has improved some but the pain has persisted.   Past Medical History:  Diagnosis Date  . IgA nephropathy   . Infertility associated with anovulation   . Kidney stones     Patient Active Problem List   Diagnosis Date Noted  . Active labor at term 03/28/2015  . Postpartum care following vaginal delivery (10/11) 03/28/2015    Past Surgical History:  Procedure Laterality Date  . RENAL BIOPSY       OB History    Gravida  3   Para  3   Term  3   Preterm  0   AB  0   Living  2     SAB  0   TAB  0   Ectopic  0   Multiple  0   Live Births  2           No family history on file.  Social History   Tobacco Use  . Smoking status: Never Smoker  . Smokeless tobacco: Never Used  Vaping Use  . Vaping Use: Some days  Substance Use Topics  . Alcohol use: Yes    Comment: weekly  . Drug use: Yes    Types: Marijuana    Home Medications Prior to Admission medications   Medication Sig Start Date End Date Taking? Authorizing Provider  calcium carbonate (TUMS - DOSED IN MG ELEMENTAL CALCIUM) 500 MG chewable tablet Chew 1 tablet by mouth daily as needed for indigestion or  heartburn.    [provider]  HYDROcodone-acetaminophen (NORCO/VICODIN) 5-325 MG tablet Take 1 tablet by mouth every 8 (eight) hours as needed. 12/12/19   Jayjay Littles S, PA-C  ibuprofen (ADVIL,MOTRIN) 600 MG tablet Take 1 tablet (600 mg total) by mouth every 6 (six) hours. 03/29/15   Laury Deep, CNM  methocarbamol (ROBAXIN) 500 MG tablet Take 1 tablet (500 mg total) by mouth 2 (two) times daily. 08/12/18   Darlin Drop P, PA-C  naproxen (NAPROSYN) 500 MG tablet Take 1 tablet (500 mg total) by mouth 2 (two) times daily. 08/12/18   Darlin Drop P, PA-C  ondansetron (ZOFRAN) 4 MG tablet Take 1 tablet (4 mg total) by mouth every 6 (six) hours. 12/12/19   Fredrick Dray S, PA-C  Prenatal Vit-Fe Fumarate-FA (PRENATAL MULTIVITAMIN) TABS Take 1 tablet by mouth daily at 12 noon.    [provider]    Allergies    Sulfa antibiotics  Review of Systems   Review of Systems  Constitutional: Positive for fatigue and fever (subjective).  HENT: Negative for ear pain and sore throat.   Eyes: Negative for visual disturbance.  Respiratory: Negative for cough and shortness of breath.  Cardiovascular: Negative for chest pain.  Gastrointestinal: Positive for abdominal pain, diarrhea, nausea and vomiting.  Genitourinary: Negative for dysuria and hematuria.  Musculoskeletal: Negative for back pain.  Skin: Negative for rash.  Neurological: Negative for headaches.  All other systems reviewed and are negative.   Physical Exam Updated Vital Signs BP 115/79 (BP Location: Right Arm)   Pulse 82   Temp 99.4 F (37.4 C) (Oral)   Resp 18   Ht 5' (1.524 m)   Wt 54.4 kg   LMP 11/21/2019   SpO2 100%   BMI 23.44 kg/m   Physical Exam Vitals and nursing note reviewed.  Constitutional:      General: She is not in acute distress.    Appearance: She is well-developed.  HENT:     Head: Normocephalic and atraumatic.  Eyes:     Conjunctiva/sclera: Conjunctivae normal.    Cardiovascular:     Rate and Rhythm: Normal rate and regular rhythm.     Heart sounds: Normal heart sounds. No murmur heard.   Pulmonary:     Effort: Pulmonary effort is normal. No respiratory distress.     Breath sounds: Normal breath sounds. No wheezing, rhonchi or rales.  Abdominal:     General: Abdomen is flat. Bowel sounds are increased.     Palpations: Abdomen is soft.     Tenderness: There is abdominal tenderness in the epigastric area and periumbilical area. There is guarding. There is no rebound.  Musculoskeletal:     Cervical back: Neck supple.  Skin:    General: Skin is warm and dry.  Neurological:     Mental Status: She is alert.     ED Results / Procedures / Treatments   Labs (all labs ordered are listed, but only abnormal results are displayed) Labs Reviewed  COMPREHENSIVE METABOLIC PANEL - Abnormal; Notable for the following components:      Result Value   Glucose, Bld 121 (*)    Total Bilirubin 1.5 (*)    All other components within normal limits  CBC - Abnormal; Notable for the following components:   WBC 13.8 (*)    RBC 5.15 (*)    Hemoglobin 15.2 (*)    HCT 47.2 (*)    All other components within normal limits  URINALYSIS, ROUTINE W REFLEX MICROSCOPIC - Abnormal; Notable for the following components:   APPearance HAZY (*)    Hgb urine dipstick SMALL (*)    Ketones, ur 20 (*)    All other components within normal limits  LIPASE, BLOOD  I-STAT BETA HCG BLOOD, ED ([facility], [facility], [facility] ONLY)    EKG None  Radiology CT ABDOMEN PELVIS W CONTRAST  Result Date: 12/12/2019 CLINICAL DATA:  Right lower quadrant pain EXAM: CT ABDOMEN AND PELVIS WITH CONTRAST TECHNIQUE: Multidetector CT imaging of the abdomen and pelvis was performed using the standard protocol following bolus administration of intravenous contrast. CONTRAST:  OMNIPAQUE IOHEXOL 300 MG/ML  SOLN COMPARISON:  12/12/2019, 07/26/2010 FINDINGS: Lower chest: No acute pleural or parenchymal lung disease.  Hepatobiliary: No focal liver abnormality is seen. No gallstones, gallbladder wall thickening, or biliary dilatation. Pancreas: Unremarkable. No pancreatic ductal dilatation or surrounding inflammatory changes. Spleen: Normal in size without focal abnormality. Adrenals/Urinary Tract: There is some early excretion of administered contrast which limits evaluation for small calculi. No evidence of obstructive uropathy within either kidney. The adrenals are grossly normal. Distal ureters and bladder are unremarkable. Stomach/Bowel: No bowel obstruction or ileus. A normal gas-filled appendix is seen within the  right hemipelvis. No bowel wall thickening or inflammatory change. Vascular/Lymphatic: No significant atherosclerosis. Punctate calcification within the right hemipelvis image 61 compatible with phlebolith. No pathologic adenopathy. Reproductive: Uterus and bilateral adnexa are unremarkable. Other: Trace pelvic free fluid is likely physiologic. No free intra-abdominal gas. No abdominal wall hernia. Musculoskeletal: No acute or destructive bony lesions. Reconstructed images demonstrate no additional findings. IMPRESSION: 1. Normal appendix. 2. Early excretion of contrast limits evaluation for small nonobstructing renal calculi. No evidence of obstructive uropathy. 3. Trace pelvic free fluid, likely physiologic. Electronically Signed   By: Sharlet Salina M.D.   On: 12/12/2019 21:24   US Abdomen Limited RUQ  Result Date: 12/12/2019 CLINICAL DATA:  Right upper quadrant pain EXAM: ULTRASOUND ABDOMEN LIMITED RIGHT UPPER QUADRANT COMPARISON:  None. FINDINGS: Gallbladder: No visible gallstones. 4 mm echogenic non mobile nonshadowing focus compatible with polyp. No wall thickening or sonographic Murphy's sign. Common bile duct: Diameter: Normal caliber, 2 mm Liver: No focal lesion identified. Within normal limits in parenchymal echogenicity. Portal vein is patent on color Doppler imaging with normal direction of blood  flow towards the liver. Other: None. IMPRESSION: Small gallbladder wall polyp. No evidence of cholelithiasis or acute cholecystitis. No acute findings. Electronically Signed   By: Charlett Nose M.D.   On: 12/12/2019 18:55    Procedures Procedures (including critical care time)  Medications Ordered in ED Medications  sodium chloride flush (NS) 0.9 % injection 3 mL (3 mLs Intravenous Given 12/12/19 1751)  dicyclomine (BENTYL) injection 20 mg (20 mg Intramuscular Given 12/12/19 1754)  ondansetron (ZOFRAN) injection 4 mg (4 mg Intravenous Given 12/12/19 1753)  sodium chloride 0.9 % bolus 1,000 mL (0 mLs Intravenous Stopped 12/12/19 1942)  famotidine (PEPCID) IVPB 20 mg premix (0 mg Intravenous Stopped 12/12/19 1903)  ketorolac (TORADOL) 30 MG/ML injection 30 mg (30 mg Intravenous Given 12/12/19 2039)  ondansetron (ZOFRAN) injection 4 mg (4 mg Intravenous Given 12/12/19 2038)  iohexol (OMNIPAQUE) 300 MG/ML solution 100 mL (100 mLs Intravenous Contrast Given 12/12/19 2108)    ED Course  I have reviewed the triage vital signs and the nursing notes.  Pertinent labs & imaging results that were available during my care of the patient were reviewed by me and considered in my medical decision making (see chart for details).    MDM Rules/Calculators/A&P                          39 year old female presenting for evaluation of periumbilical abdominal pain.  Also with nausea vomiting and diarrhea that started last.  Reviewed/interpreted labs CBC with mild leukocytosis, no anemia CMP with elevated bili, normal LFTs, normal kidney function and electrolytes Lipase is negative Beta-hCG negative UA with hematuria otherwise no evidence of UTI  RUQ [place] Small gallbladder wall polyp. No evidence of cholelithiasis or acute cholecystitis. No acute findings.   7:25 PM Rechecked patient.  She states she is feeling much improved after medication. Repeat abdominal exam reveals minimal tenderness to the right upper  quadrant and very mild tenderness of right lower quadrant.  Discussed results of work-up thus far.  Discussed likely hood of viral infection such as gastroenteritis however considered other causes of her symptoms such as appendicitis.  Offered CT scan and had shared decision-making about obtaining CT scan at this time.  Patient states she is feeling improved and would like to hold off on CT scan but will return to the ED if any of her symptoms return or worsen.  Will recheck after UA  8:30 PM Rechecked pt after UA, she was able to tolerate po but is having recurrence of pain. Will give Toradol and obtain CT abd/pelvis   CT abd/pelvis with normal appendix. Early excretion of contrast limits evaluation for small nonobstructing renal calculi. No evidence of obstructive uropathy. 3. Trace pelvic free fluid, likely physiologic.  Reassessed patient.  She feels improved after Toradol.  Discussed results of the abdominal CT scan showing no acute findings.  Likely either viral etiology of symptoms or she may have a small kidney stone.  Will give Norco and Zofran for home.  She is allergic to Flomax.  Will advised to follow-up with PCP and return if worse.  She voices understanding of plan reasons to return.  All questions answered.  Patient stable for discharge.  Final Clinical Impression(s) / ED Diagnoses Final diagnoses:  RUQ pain  Nausea vomiting and diarrhea    Rx / DC Orders ED Discharge Orders         Ordered    HYDROcodone-acetaminophen (NORCO/VICODIN) 5-325 MG tablet  Every 8 hours PRN     Discontinue  Reprint     12/12/19 2157    ondansetron (ZOFRAN) 4 MG tablet  Every 6 hours     Discontinue  Reprint     12/12/19 2157           Karrie Meres, PA-C 12/12/19 2202    Margarita Grizzle, MD 12/16/19 1551

## 2019-12-12 NOTE — ED Triage Notes (Signed)
Reports mid abd pain since 4am with nausea, vomiting, and diarrhea.  Took OTC nausea medication with relief of vomiting.

## 2019-12-12 NOTE — ED Notes (Signed)
Patient verbalizes understanding of discharge instructions. Opportunity for questioning and answers were provided. Armband removed by staff, pt discharged from ED ambulatory to home.  

## 2019-12-12 NOTE — ED Notes (Signed)
Pt transported to [facility]

## 2019-12-12 NOTE — ED Notes (Signed)
Pt transpoorted to CT

## 2020-11-07 IMAGING — CT CT ABD-PELV W/ CM
2 of 4 series · 16 of 46 positions shown, 18 images · IV contrast (APPLIED)
Comparison: 12/12/2019, 07/26/2010

CLINICAL DATA: Right lower quadrant pain

EXAM:
CT ABDOMEN AND PELVIS WITH CONTRAST
TECHNIQUE: Multidetector CT imaging of the abdomen and pelvis was performed
using the standard protocol following bolus administration of
intravenous contrast.
CONTRAST:  100mL OMNIPAQUE IOHEXOL 300 MG/ML  SOLN

[Series 3: abdomen 5.0 · axial · 0.62mm/px · z∈[-430,-85]mm · 13 of 79 slices shown, 15 images]
[im 5/79  soft-tissue]
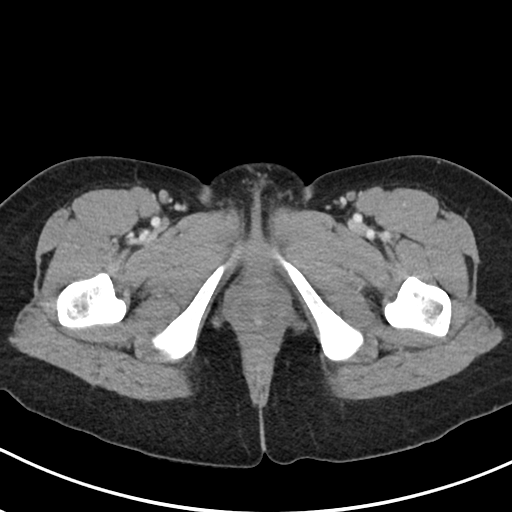
[im 5/79  bone]
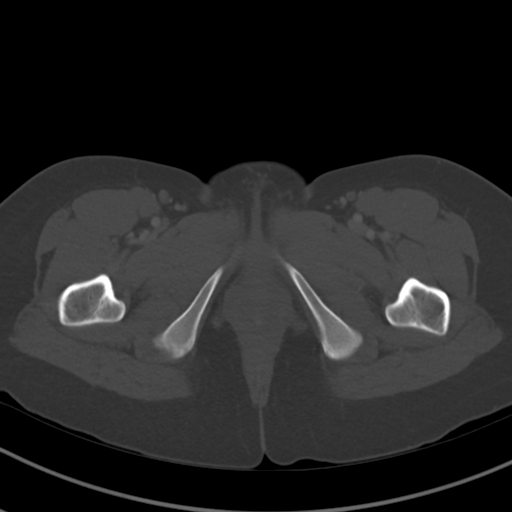
[im 10/79  soft-tissue]
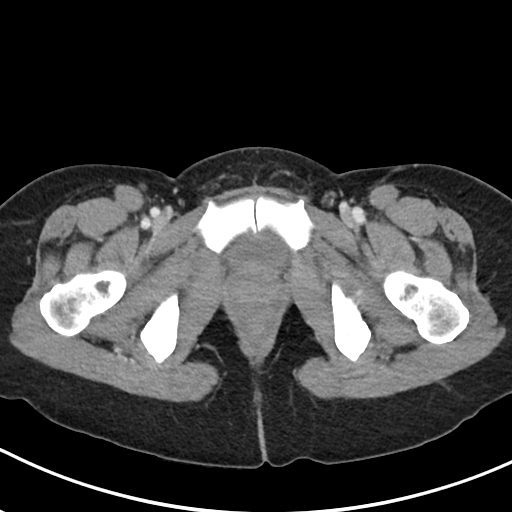
[im 19/79  soft-tissue]
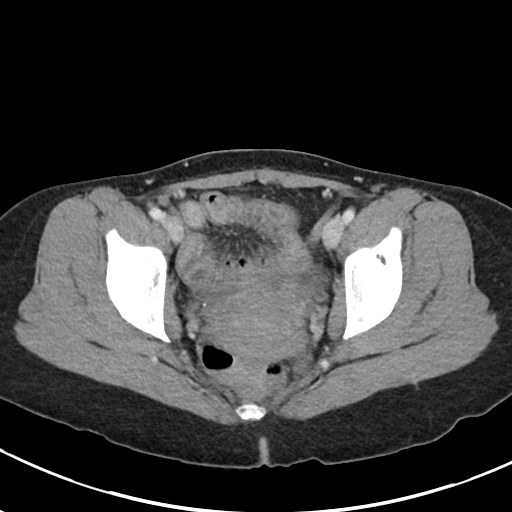
[im 23/79  soft-tissue]
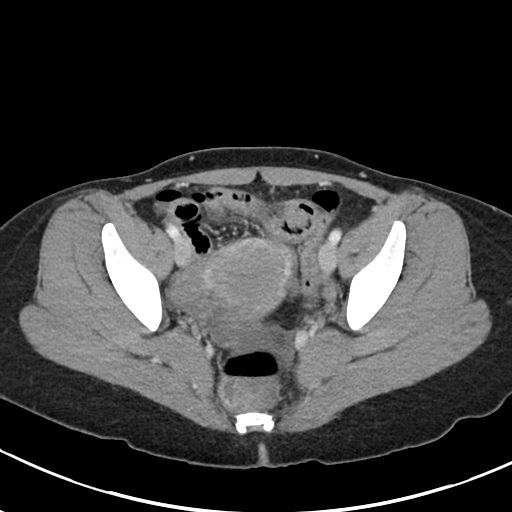
[im 28/79  soft-tissue]
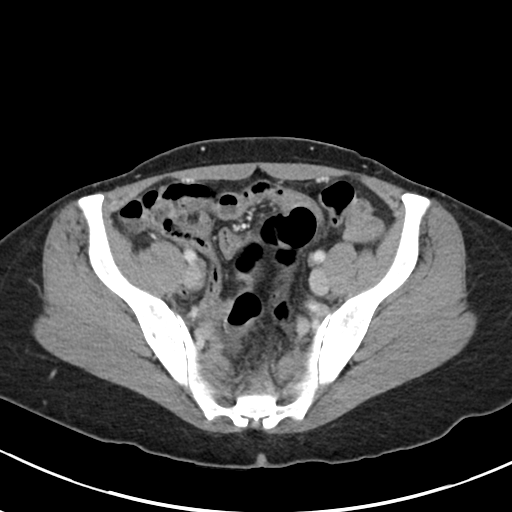
[im 33/79  soft-tissue]
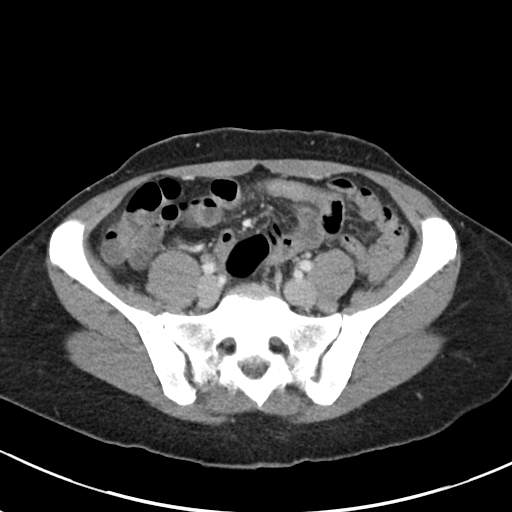
[im 42/79  soft-tissue]
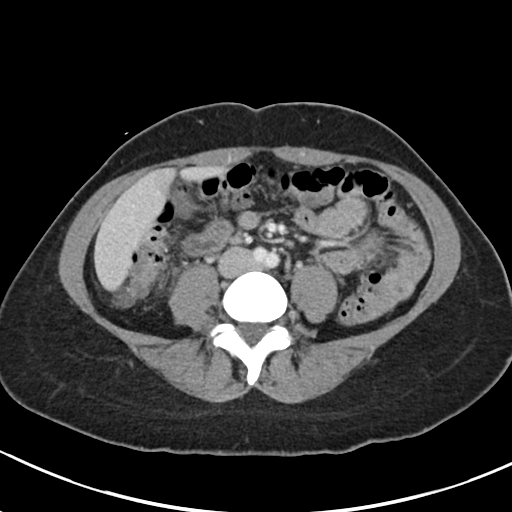
[im 46/79  soft-tissue]
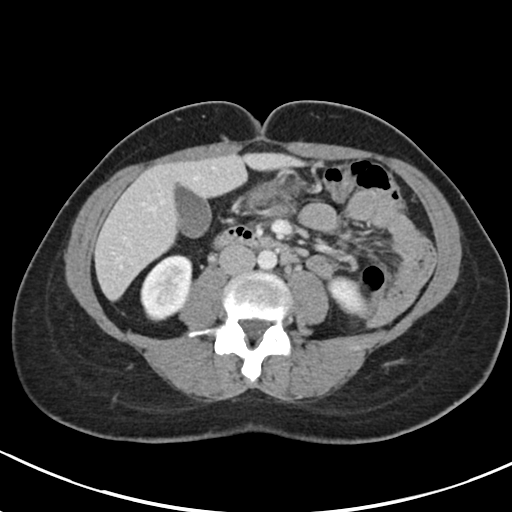
[im 51/79  soft-tissue]
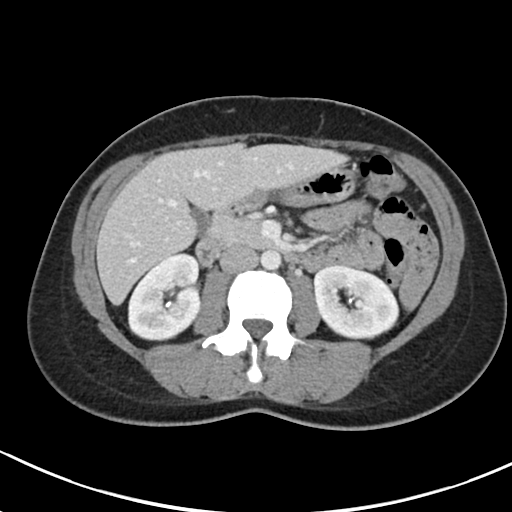
[im 51/79  bone]
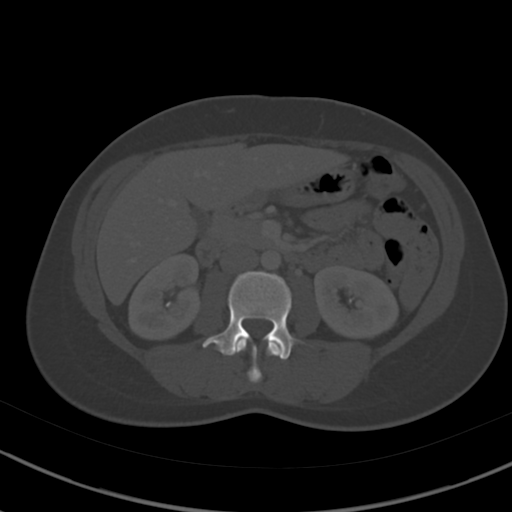
[im 56/79  soft-tissue]
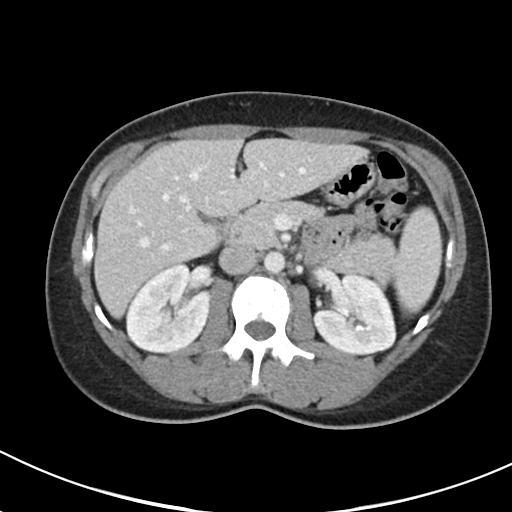
[im 60/79  soft-tissue]
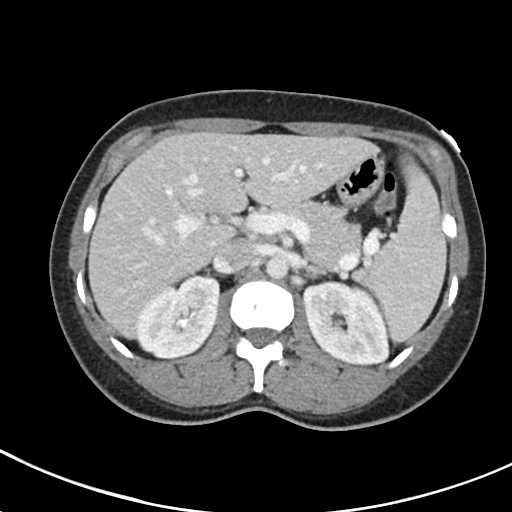
[im 69/79  soft-tissue]
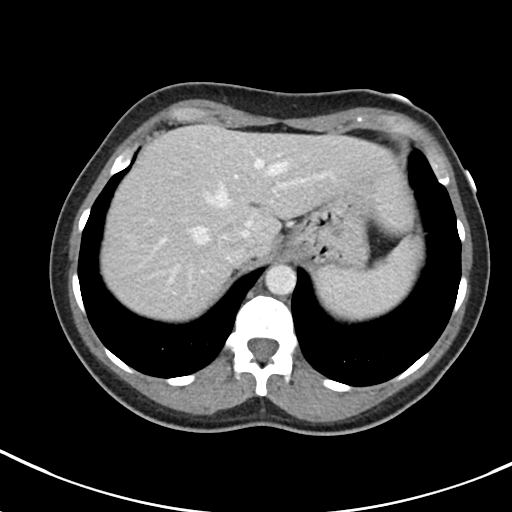
[im 74/79  soft-tissue]
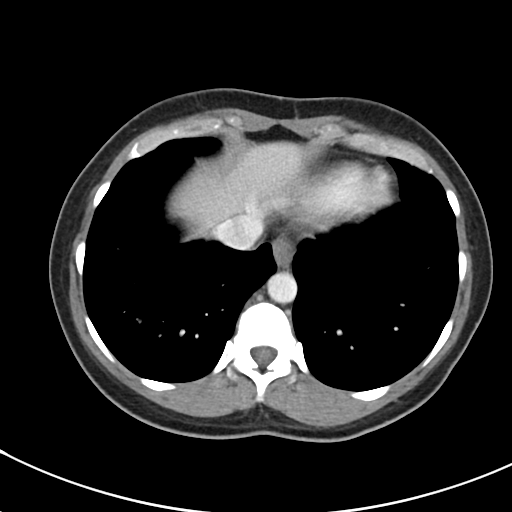

[Series 6: abdomen 3.0 mpr cor · coronal · 0.63mm/px · 3 of 81 slices shown]
[im 27/81  soft-tissue]
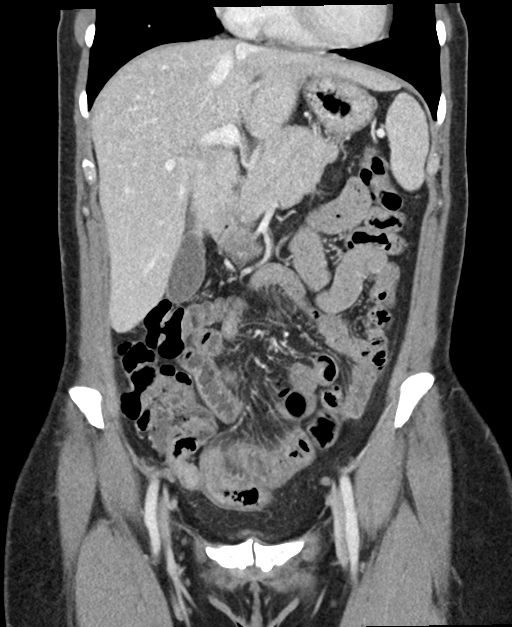
[im 36/81  soft-tissue]
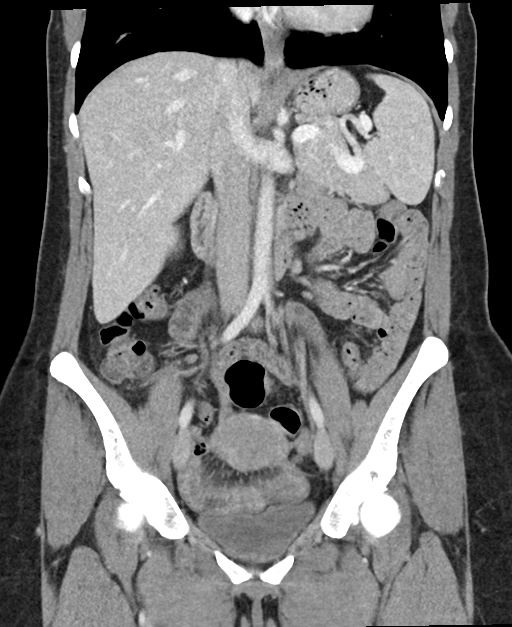
[im 45/81  soft-tissue]
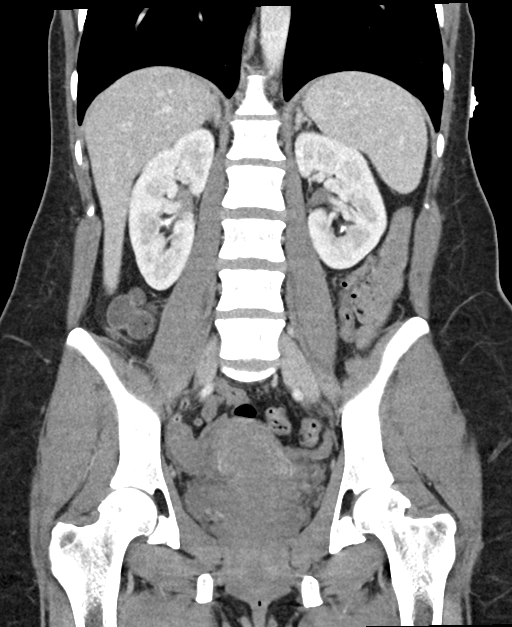

[16 of 46 positions shown; findings below may reference images not displayed]

FINDINGS: Lower chest: No acute pleural or parenchymal lung disease.

Hepatobiliary: No focal liver abnormality is seen. No gallstones,
gallbladder wall thickening, or biliary dilatation.

Pancreas: Unremarkable. No pancreatic ductal dilatation or
surrounding inflammatory changes.

Spleen: Normal in size without focal abnormality.

Adrenals/Urinary Tract: There is some early excretion of
administered contrast which limits evaluation for small calculi. No
evidence of obstructive uropathy within either kidney. The adrenals
are grossly normal. Distal ureters and bladder are unremarkable.

Stomach/Bowel: No bowel obstruction or ileus. A normal gas-filled
appendix is seen within the right hemipelvis. No bowel wall
thickening or inflammatory change.

Vascular/Lymphatic: No significant atherosclerosis. Punctate
calcification within the right hemipelvis image 61 compatible with
phlebolith. No pathologic adenopathy.

Reproductive: Uterus and bilateral adnexa are unremarkable.

Other: Trace pelvic free fluid is likely physiologic. No free
intra-abdominal gas. No abdominal wall hernia.

Musculoskeletal: No acute or destructive bony lesions. Reconstructed
images demonstrate no additional findings.
IMPRESSION: 1. Normal appendix.
2. Early excretion of contrast limits evaluation for small
nonobstructing renal calculi. No evidence of obstructive uropathy.
3. Trace pelvic free fluid, likely physiologic.
